# Patient Record
Sex: Male | Born: 1985 | Hispanic: Yes | Marital: Single | Smoking: Light tobacco smoker
Health system: Southern US, Community
[De-identification: ages and names within clinical notes are randomized; demographics above are authoritative.]

## PROBLEM LIST (undated history)

## (undated) DIAGNOSIS — E119 Type 2 diabetes mellitus without complications: Secondary | ICD-10-CM

## (undated) DIAGNOSIS — M549 Dorsalgia, unspecified: Secondary | ICD-10-CM

---

## 2019-07-16 ENCOUNTER — Emergency Department (HOSPITAL_COMMUNITY): Payer: Self-pay

## 2019-07-16 ENCOUNTER — Other Ambulatory Visit: Payer: Self-pay

## 2019-07-16 ENCOUNTER — Inpatient Hospital Stay (HOSPITAL_COMMUNITY): Payer: Self-pay

## 2019-07-16 ENCOUNTER — Inpatient Hospital Stay (HOSPITAL_COMMUNITY)
Admission: EM | Admit: 2019-07-16 | Discharge: 2019-07-21 | DRG: 460 | Disposition: A | Discharge status: Rehabilitation Facility | Payer: Self-pay | Attending: Neurosurgery | Admitting: Neurosurgery

## 2019-07-16 ENCOUNTER — Encounter (HOSPITAL_COMMUNITY): Payer: Self-pay | Admitting: Emergency Medicine

## 2019-07-16 DIAGNOSIS — F17210 Nicotine dependence, cigarettes, uncomplicated: Secondary | ICD-10-CM | POA: Diagnosis present

## 2019-07-16 DIAGNOSIS — Y990 Civilian activity done for income or pay: Secondary | ICD-10-CM

## 2019-07-16 DIAGNOSIS — S22010A Wedge compression fracture of first thoracic vertebra, initial encounter for closed fracture: Principal | ICD-10-CM | POA: Diagnosis present

## 2019-07-16 DIAGNOSIS — Y9269 Other specified industrial and construction area as the place of occurrence of the external cause: Secondary | ICD-10-CM

## 2019-07-16 DIAGNOSIS — Z419 Encounter for procedure for purposes other than remedying health state, unspecified: Secondary | ICD-10-CM

## 2019-07-16 DIAGNOSIS — Z20822 Contact with and (suspected) exposure to covid-19: Secondary | ICD-10-CM | POA: Diagnosis present

## 2019-07-16 DIAGNOSIS — S12690A Other displaced fracture of seventh cervical vertebra, initial encounter for closed fracture: Secondary | ICD-10-CM | POA: Diagnosis present

## 2019-07-16 DIAGNOSIS — S12190A Other displaced fracture of second cervical vertebra, initial encounter for closed fracture: Secondary | ICD-10-CM | POA: Diagnosis present

## 2019-07-16 DIAGNOSIS — E1165 Type 2 diabetes mellitus with hyperglycemia: Secondary | ICD-10-CM | POA: Diagnosis not present

## 2019-07-16 DIAGNOSIS — W208XXA Other cause of strike by thrown, projected or falling object, initial encounter: Secondary | ICD-10-CM | POA: Diagnosis present

## 2019-07-16 DIAGNOSIS — M4804 Spinal stenosis, thoracic region: Secondary | ICD-10-CM | POA: Diagnosis present

## 2019-07-16 DIAGNOSIS — M4802 Spinal stenosis, cervical region: Secondary | ICD-10-CM | POA: Diagnosis present

## 2019-07-16 DIAGNOSIS — S22000A Wedge compression fracture of unspecified thoracic vertebra, initial encounter for closed fracture: Secondary | ICD-10-CM | POA: Diagnosis present

## 2019-07-16 DIAGNOSIS — Z833 Family history of diabetes mellitus: Secondary | ICD-10-CM

## 2019-07-16 DIAGNOSIS — R0902 Hypoxemia: Secondary | ICD-10-CM | POA: Diagnosis present

## 2019-07-16 DIAGNOSIS — S22009A Unspecified fracture of unspecified thoracic vertebra, initial encounter for closed fracture: Secondary | ICD-10-CM | POA: Diagnosis present

## 2019-07-16 DIAGNOSIS — S0001XA Abrasion of scalp, initial encounter: Secondary | ICD-10-CM | POA: Diagnosis present

## 2019-07-16 DIAGNOSIS — D62 Acute posthemorrhagic anemia: Secondary | ICD-10-CM | POA: Diagnosis not present

## 2019-07-16 HISTORY — DX: Type 2 diabetes mellitus without complications: E11.9

## 2019-07-16 HISTORY — DX: Dorsalgia, unspecified: M54.9

## 2019-07-16 LAB — CBC WITH DIFFERENTIAL/PLATELET
Abs Immature Granulocytes: 0.15 10*3/uL — ABNORMAL HIGH (ref 0.00–0.07)
Basophils Absolute: 0.1 10*3/uL (ref 0.0–0.1)
Basophils Relative: 0 %
Eosinophils Absolute: 0 10*3/uL (ref 0.0–0.5)
Eosinophils Relative: 0 %
HCT: 45.9 % (ref 39.0–52.0)
Hemoglobin: 16.5 g/dL (ref 13.0–17.0)
Immature Granulocytes: 1 %
Lymphocytes Relative: 5 %
Lymphs Abs: 1.1 10*3/uL (ref 0.7–4.0)
MCH: 34 pg (ref 26.0–34.0)
MCHC: 35.9 g/dL (ref 30.0–36.0)
MCV: 94.6 fL (ref 80.0–100.0)
Monocytes Absolute: 1.2 10*3/uL — ABNORMAL HIGH (ref 0.1–1.0)
Monocytes Relative: 6 %
Neutro Abs: 17.3 10*3/uL — ABNORMAL HIGH (ref 1.7–7.7)
Neutrophils Relative %: 88 %
Platelets: 292 10*3/uL (ref 150–400)
RBC: 4.85 MIL/uL (ref 4.22–5.81)
RDW: 11.7 % (ref 11.5–15.5)
WBC: 19.8 10*3/uL — ABNORMAL HIGH (ref 4.0–10.5)
nRBC: 0 % (ref 0.0–0.2)

## 2019-07-16 LAB — BASIC METABOLIC PANEL
Anion gap: 11 (ref 5–15)
Anion gap: 14 (ref 5–15)
BUN: 8 mg/dL (ref 6–20)
BUN: 8 mg/dL (ref 6–20)
CO2: 24 mmol/L (ref 22–32)
CO2: 22 mmol/L (ref 22–32)
Calcium: 9.2 mg/dL (ref 8.9–10.3)
Calcium: 8.9 mg/dL (ref 8.9–10.3)
Chloride: 97 mmol/L — ABNORMAL LOW (ref 98–111)
Chloride: 99 mmol/L (ref 98–111)
Creatinine, Ser: 0.72 mg/dL (ref 0.61–1.24)
Creatinine, Ser: 0.66 mg/dL (ref 0.61–1.24)
GFR calc Af Amer: >60 mL/min (ref >60)
GFR calc Af Amer: >60 mL/min (ref >60)
GFR calc non Af Amer: >60 mL/min (ref >60)
GFR calc non Af Amer: >60 mL/min (ref >60)
Glucose, Bld: 299 mg/dL — ABNORMAL HIGH (ref 70–99)
Glucose, Bld: 223 mg/dL — ABNORMAL HIGH (ref 70–99)
Potassium: 4.4 mmol/L (ref 3.5–5.1)
Potassium: 4.3 mmol/L (ref 3.5–5.1)
Sodium: 132 mmol/L — ABNORMAL LOW (ref 135–145)
Sodium: 135 mmol/L (ref 135–145)

## 2019-07-16 LAB — CBC
HCT: 45 % (ref 39.0–52.0)
Hemoglobin: 16.1 g/dL (ref 13.0–17.0)
MCH: 33.7 pg (ref 26.0–34.0)
MCHC: 35.8 g/dL (ref 30.0–36.0)
MCV: 94.1 fL (ref 80.0–100.0)
Platelets: 312 10*3/uL (ref 150–400)
RBC: 4.78 MIL/uL (ref 4.22–5.81)
RDW: 11.7 % (ref 11.5–15.5)
WBC: 13 10*3/uL — ABNORMAL HIGH (ref 4.0–10.5)
nRBC: 0 % (ref 0.0–0.2)

## 2019-07-16 LAB — PROTIME-INR
INR: 1 (ref 0.8–1.2)
Prothrombin Time: 13.5 s (ref 11.4–15.2)

## 2019-07-16 LAB — RESPIRATORY PANEL BY RT PCR (FLU A&B, COVID)
Influenza A by PCR: NEGATIVE
Influenza B by PCR: NEGATIVE
SARS Coronavirus 2 by RT PCR: NEGATIVE

## 2019-07-16 LAB — HIV ANTIBODY (ROUTINE TESTING W REFLEX): HIV Screen 4th Generation wRfx: NONREACTIVE

## 2019-07-16 MED ORDER — ONDANSETRON HCL 4 MG/2ML IJ SOLN: 4.0000 mg | Freq: Four times a day (QID) | INTRAMUSCULAR | Status: DC | PRN

## 2019-07-16 MED ORDER — DOCUSATE SODIUM 100 MG PO CAPS
100.0000 mg | ORAL_CAPSULE | Freq: Two times a day (BID) | ORAL | Status: DC
  Administered 2019-07-16 – 2019-07-20 (×6): 100 mg via ORAL
  Filled 2019-07-16 (×7): qty 1

## 2019-07-16 MED ORDER — ACETAMINOPHEN 650 MG RE SUPP: 650.0000 mg | Freq: Four times a day (QID) | RECTAL | Status: DC | PRN

## 2019-07-16 MED ORDER — ONDANSETRON HCL 4 MG PO TABS: 4.0000 mg | ORAL_TABLET | Freq: Four times a day (QID) | ORAL | Status: DC | PRN

## 2019-07-16 MED ORDER — OXYCODONE-ACETAMINOPHEN 5-325 MG PO TABS
1.0000 | ORAL_TABLET | Freq: Once | ORAL | Status: AC
  Administered 2019-07-16: 1 via ORAL
  Filled 2019-07-16: qty 1

## 2019-07-16 MED ORDER — ACETAMINOPHEN 325 MG PO TABS
650.0000 mg | ORAL_TABLET | Freq: Once | ORAL | Status: AC
  Administered 2019-07-16: 650 mg via ORAL
  Filled 2019-07-16: qty 2

## 2019-07-16 MED ORDER — FENTANYL CITRATE (PF) 100 MCG/2ML IJ SOLN
25.0000 ug | Freq: Once | INTRAMUSCULAR | Status: AC
  Administered 2019-07-16: 25 ug via INTRAVENOUS
  Filled 2019-07-16: qty 2

## 2019-07-16 MED ORDER — HYDROCODONE-ACETAMINOPHEN 5-325 MG PO TABS
1.0000 | ORAL_TABLET | ORAL | Status: DC | PRN
  Administered 2019-07-18 – 2019-07-21 (×5): 2 via ORAL
  Filled 2019-07-16 (×6): qty 2

## 2019-07-16 MED ORDER — IOHEXOL 350 MG/ML SOLN
50.0000 mL | Freq: Once | INTRAVENOUS | Status: AC | PRN
  Administered 2019-07-16: 50 mL via INTRAVENOUS

## 2019-07-16 MED ORDER — ACETAMINOPHEN 325 MG PO TABS
650.0000 mg | ORAL_TABLET | Freq: Four times a day (QID) | ORAL | Status: DC | PRN
  Administered 2019-07-16 – 2019-07-19 (×2): 650 mg via ORAL
  Filled 2019-07-16 (×3): qty 2

## 2019-07-16 MED ORDER — ONDANSETRON HCL 4 MG/2ML IJ SOLN
4.0000 mg | Freq: Once | INTRAMUSCULAR | Status: AC
  Administered 2019-07-16: 4 mg via INTRAVENOUS
  Filled 2019-07-16: qty 2

## 2019-07-16 MED ORDER — HYDROMORPHONE HCL 1 MG/ML IJ SOLN
1.0000 mg | INTRAMUSCULAR | Status: DC | PRN
  Administered 2019-07-16 – 2019-07-20 (×5): 1 mg via INTRAVENOUS
  Filled 2019-07-16 (×6): qty 1

## 2019-07-16 MED ORDER — SODIUM CHLORIDE 0.9 % IV SOLN: INTRAVENOUS | Status: DC

## 2019-07-16 MED ORDER — HYDROMORPHONE HCL 1 MG/ML IJ SOLN
1.0000 mg | Freq: Once | INTRAMUSCULAR | Status: AC
  Administered 2019-07-16: 1 mg via INTRAVENOUS
  Filled 2019-07-16: qty 1

## 2019-07-16 MED ORDER — SENNA 8.6 MG PO TABS
1.0000 | ORAL_TABLET | Freq: Two times a day (BID) | ORAL | Status: DC
  Administered 2019-07-16 – 2019-07-20 (×6): 8.6 mg via ORAL
  Filled 2019-07-16 (×7): qty 1

## 2019-07-16 NOTE — ED Notes (Signed)
Patient transported to CT 

## 2019-07-16 NOTE — ED Notes (Signed)
Patient transported to MRI 

## 2019-07-16 NOTE — ED Triage Notes (Signed)
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 Pt was working when a 2x4 wall approx. 16 ft tall fell and struck him in the head. Pt denies LOC. Pt complaining of cervical neck pain. NKDA. VSS.

## 2019-07-16 NOTE — H&P (Signed)
Charles Perez is an 34 y.o. male.   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  Patient was brought to the emergency department was admitted worked up CT scan subsequent MRI scan shows what looks like a type I hangman's fracture as well as a T1 burst fracture dislocation.  History reviewed. No pertinent past medical history.  History reviewed. No pertinent surgical history.  History reviewed. No pertinent family history. Social History:  reports that he has been smoking cigarettes. He has never used smokeless tobacco. He reports that he does not use drugs. No history on file for alcohol.  Allergies: No Known Allergies  (Not in a hospital admission)   Results for orders placed or performed during the hospital encounter of 07/16/19 (from the past 48 hour(s))  CBC with Differential     Status: Abnormal   Collection Time: 07/16/19  2:05 PM  Result Value Ref Range   WBC 19.8 (H) 4.0 - 10.5 K/uL   RBC 4.85 4.22 - 5.81 MIL/uL   Hemoglobin 16.5 13.0 - 17.0 g/dL   HCT 04.5 40.9 - 81.1 %   MCV 94.6 80.0 - 100.0 fL   MCH 34.0 26.0 - 34.0 pg   MCHC 35.9 30.0 - 36.0 g/dL   RDW 91.4 78.2 - 95.6 %   Platelets 292 150 - 400 K/uL   nRBC 0.0 0.0 - 0.2 %   Neutrophils Relative % 88 %   Neutro Abs 17.3 (H) 1.7 - 7.7 K/uL   Lymphocytes Relative 5 %   Lymphs Abs 1.1 0.7 - 4.0 K/uL   Monocytes Relative 6 %   Monocytes Absolute 1.2 (H) 0.1 - 1.0 K/uL   Eosinophils Relative 0 %   Eosinophils Absolute 0.0 0.0 - 0.5 K/uL   Basophils Relative 0 %   Basophils Absolute 0.1 0.0 - 0.1 K/uL   Immature Granulocytes 1 %   Abs Immature Granulocytes 0.15 (H) 0.00 - 0.07 K/uL    Comment: Performed at [facility], [address] [place], [place] [address]  Basic metabolic panel      Status: Abnormal   Collection Time: 07/16/19  2:05 PM  Result Value Ref Range   Sodium 132 (L) 135 - 145 mmol/L   Potassium 4.4 3.5 - 5.1 mmol/L   Chloride 97 (L) 98 - 111 mmol/L   CO2 24 22 - 32 mmol/L   Glucose, Bld 299 (H) 70 - 99 mg/dL    Comment: Glucose reference range applies only to samples taken after fasting for at least 8 hours.   BUN 8 6 - 20 mg/dL   Creatinine, Ser 6.57 0.61 - 1.24 mg/dL   Calcium 9.2 8.9 - 84.6 mg/dL   GFR calc non Af Amer >60 >60 mL/min   GFR calc Af Amer >60 >60 mL/min   Anion gap 11 5 - 15    Comment: Performed at [facility], [address], [place], [place] [address]   CT Angio Head W or [place] Contrast  Result Date: 07/16/2019 CLINICAL DATA:  Cervical spine fracture. Status post trauma. Multiple cervical spine fractures. Additional history provided: Poly trauma, critical, head/cervical spine injury suspected. EXAM: CT ANGIOGRAPHY HEAD AND NECK TECHNIQUE: Multidetector CT imaging of the head and neck was performed using the standard protocol during  bolus administration of intravenous contrast. Multiplanar CT image reconstructions and MIPs were obtained to evaluate the vascular anatomy. Carotid stenosis measurements (when applicable) are obtained utilizing NASCET criteria, using the distal internal carotid diameter as the denominator. CONTRAST:  43mL OMNIPAQUE IOHEXOL 350 MG/ML SOLN COMPARISON:  CT head/cervical spine performed earlier the same day 07/16/2019 FINDINGS: CTA NECK FINDINGS Aortic arch: Common origin of the innominate and left common carotid arteries. The visualized aortic arch is unremarkable. No innominate or proximal subclavian artery stenosis. Right carotid system: The CCA and ICA are smooth and patent within the neck without stenosis. Left carotid system: CCA and ICA patent within the neck without stenosis Vertebral arteries: The vertebral arteries are codominant and patent within the neck without stenosis. No evidence of dissection or  pseudoaneurysm. Skeleton: Please refer to same day CT cervical spine for a description of multiple acute cervical and upper thoracic spine fractures. Other neck: No neck mass or cervical lymphadenopathy. Upper chest: No consolidation within the imaged lung apices. Review of the MIP images confirms the above findings CTA HEAD FINDINGS Anterior circulation: The intracranial internal carotid arteries are patent without significant stenosis. The M1 middle cerebral arteries are patent without significant stenosis. No M2 proximal branch occlusion or high-grade proximal stenosis is identified. The anterior cerebral arteries are patent without high-grade proximal stenosis. No intracranial aneurysm is identified. Posterior circulation: The intracranial vertebral arteries are patent without stenosis, as is the basilar artery. The posterior cerebral arteries are patent proximally without significant stenosis. A small left posterior communicating artery is present. A right posterior communicating artery is not definitively identified. Venous sinuses: Within limitations of contrast timing, no convincing thrombus. Anatomic variants: As described Review of the MIP images confirms the above findings IMPRESSION: CTA neck: 1. The bilateral common carotid, internal carotid and vertebral arteries are patent within the neck without evidence of traumatic vascular injury. 2. Please refer to same day CT cervical spine for a description of multiple acute fractures of the cervical and upper thoracic spine. CTA head: Unremarkable examination. No intracranial large vessel occlusion or proximal high-grade arterial stenosis. Electronically Signed   By: Jackey Loge DO   On: 07/16/2019 14:49   DG Thoracic Spine 2 View  Result Date: 07/16/2019 CLINICAL DATA:  Pain after trauma. EXAM: THORACIC SPINE 2 VIEWS COMPARISON:  None. FINDINGS: Mild curvature of the thoracic spine, apex to the right. No other malalignment. No fractures are seen. Minimal  degenerative disc disease. IMPRESSION: Mild scoliotic curvature of the thoracic spine, apex to the right. Mild degenerative changes. No other abnormalities. Electronically Signed   By: Gerome Sam III M.D   On: 07/16/2019 12:05   CT Head Wo Contrast  Result Date: 07/16/2019 CLINICAL DATA:  Neck pain after being hit in head by falling wall. EXAM: CT HEAD WITHOUT CONTRAST CT CERVICAL SPINE WITHOUT CONTRAST TECHNIQUE: Multidetector CT imaging of the head and cervical spine was performed following the standard protocol without intravenous contrast. Multiplanar CT image reconstructions of the cervical spine were also generated. COMPARISON:  None. FINDINGS: CT HEAD FINDINGS Brain: No evidence of acute infarction, hemorrhage, hydrocephalus, extra-axial collection or mass lesion/mass effect. Vascular: No hyperdense vessel or unexpected calcification. Skull: Normal. Negative for fracture or focal lesion. Sinuses/Orbits: Left maxillary mucous retention cyst is noted. Other: None. CT CERVICAL SPINE FINDINGS Alignment: Normal. Skull base and vertebrae: There appears to be a moderately compressed T1 fracture with possible posterior displacement of fracture fragment into spinal canal. Moderately displaced fracture is seen involving the posterior spinous  process of C7. Mildly displaced fractures are seen involving the bilateral pedicles of C2. Moderately displaced fracture is also seen involving the left lateral mass of C2, with disruption of the left transverse foramina. Fracture is also seen extending through the right transverse foramen. Soft tissues and spinal canal: No prevertebral fluid or swelling. No visible canal hematoma. Disc levels:  Disc spaces are maintained. Upper chest: Negative. Other: None. IMPRESSION: 1. Normal head CT. 2. Mildly displaced fractures are seen involving the bilateral pedicles of C2. Moderately displaced fracture is also seen involving the left lateral mass of C2, with disruption of the  left transverse foramina. Fracture is also seen extending through the right transverse foramen. This is concerning for vertebral artery injury, and CT angiography of the neck is recommended for further evaluation 3. Moderately displaced fracture is seen involving the posterior spinous process of C7, with mildly displaced fracture involving distal tip of spinous process of T1. 4. Moderately compressed T1 vertebral body fracture is noted with possible posterior displacement of fracture fragment into spinal canal. Critical Value/emergent results were called by telephone at the time of interpretation on 07/16/2019 at 1:15 pm to [facility] , who verbally acknowledged these results. Electronically Signed   By: Lupita RaiderJames  Green Jr M.D.   On: 07/16/2019 13:16   CT Angio Neck W and/or Wo Contrast  Result Date: 07/16/2019 CLINICAL DATA:  Cervical spine fracture. Status post trauma. Multiple cervical spine fractures. Additional history provided: Poly trauma, critical, head/cervical spine injury suspected. EXAM: CT ANGIOGRAPHY HEAD AND NECK TECHNIQUE: Multidetector CT imaging of the head and neck was performed using the standard protocol during bolus administration of intravenous contrast. Multiplanar CT image reconstructions and MIPs were obtained to evaluate the vascular anatomy. Carotid stenosis measurements (when applicable) are obtained utilizing NASCET criteria, using the distal internal carotid diameter as the denominator. CONTRAST:  50mL OMNIPAQUE IOHEXOL 350 MG/ML SOLN COMPARISON:  CT head/cervical spine performed earlier the same day 07/16/2019 FINDINGS: CTA NECK FINDINGS Aortic arch: Common origin of the innominate and left common carotid arteries. The visualized aortic arch is unremarkable. No innominate or proximal subclavian artery stenosis. Right carotid system: The CCA and ICA are smooth and patent within the neck without stenosis. Left carotid system: CCA and ICA patent within the neck without stenosis  Vertebral arteries: The vertebral arteries are codominant and patent within the neck without stenosis. No evidence of dissection or pseudoaneurysm. Skeleton: Please refer to same day CT cervical spine for a description of multiple acute cervical and upper thoracic spine fractures. Other neck: No neck mass or cervical lymphadenopathy. Upper chest: No consolidation within the imaged lung apices. Review of the MIP images confirms the above findings CTA HEAD FINDINGS Anterior circulation: The intracranial internal carotid arteries are patent without significant stenosis. The M1 middle cerebral arteries are patent without significant stenosis. No M2 proximal branch occlusion or high-grade proximal stenosis is identified. The anterior cerebral arteries are patent without high-grade proximal stenosis. No intracranial aneurysm is identified. Posterior circulation: The intracranial vertebral arteries are patent without stenosis, as is the basilar artery. The posterior cerebral arteries are patent proximally without significant stenosis. A small left posterior communicating artery is present. A right posterior communicating artery is not definitively identified. Venous sinuses: Within limitations of contrast timing, no convincing thrombus. Anatomic variants: As described Review of the MIP images confirms the above findings IMPRESSION: CTA neck: 1. The bilateral common carotid, internal carotid and vertebral arteries are patent within the neck without evidence of traumatic vascular  injury. 2. Please refer to same day CT cervical spine for a description of multiple acute fractures of the cervical and upper thoracic spine. CTA head: Unremarkable examination. No intracranial large vessel occlusion or proximal high-grade arterial stenosis. Electronically Signed   By: Jackey Loge DO   On: 07/16/2019 14:49   CT Cervical Spine Wo Contrast  Result Date: 07/16/2019 CLINICAL DATA:  Neck pain after being hit in head by falling wall.  EXAM: CT HEAD WITHOUT CONTRAST CT CERVICAL SPINE WITHOUT CONTRAST TECHNIQUE: Multidetector CT imaging of the head and cervical spine was performed following the standard protocol without intravenous contrast. Multiplanar CT image reconstructions of the cervical spine were also generated. COMPARISON:  None. FINDINGS: CT HEAD FINDINGS Brain: No evidence of acute infarction, hemorrhage, hydrocephalus, extra-axial collection or mass lesion/mass effect. Vascular: No hyperdense vessel or unexpected calcification. Skull: Normal. Negative for fracture or focal lesion. Sinuses/Orbits: Left maxillary mucous retention cyst is noted. Other: None. CT CERVICAL SPINE FINDINGS Alignment: Normal. Skull base and vertebrae: There appears to be a moderately compressed T1 fracture with possible posterior displacement of fracture fragment into spinal canal. Moderately displaced fracture is seen involving the posterior spinous process of C7. Mildly displaced fractures are seen involving the bilateral pedicles of C2. Moderately displaced fracture is also seen involving the left lateral mass of C2, with disruption of the left transverse foramina. Fracture is also seen extending through the right transverse foramen. Soft tissues and spinal canal: No prevertebral fluid or swelling. No visible canal hematoma. Disc levels:  Disc spaces are maintained. Upper chest: Negative. Other: None. IMPRESSION: 1. Normal head CT. 2. Mildly displaced fractures are seen involving the bilateral pedicles of C2. Moderately displaced fracture is also seen involving the left lateral mass of C2, with disruption of the left transverse foramina. Fracture is also seen extending through the right transverse foramen. This is concerning for vertebral artery injury, and CT angiography of the neck is recommended for further evaluation 3. Moderately displaced fracture is seen involving the posterior spinous process of C7, with mildly displaced fracture involving distal tip  of spinous process of T1. 4. Moderately compressed T1 vertebral body fracture is noted with possible posterior displacement of fracture fragment into spinal canal. Critical Value/emergent results were called by telephone at the time of interpretation on 07/16/2019 at 1:15 pm to provider [facility] , who verbally acknowledged these results. Electronically Signed   By: Lupita Raider M.D.   On: 07/16/2019 13:16   MR Cervical Spine Wo Contrast  Result Date: 07/16/2019 CLINICAL DATA:  Spine fracture EXAM: MRI CERVICAL AND THORACIC SPINE WITHOUT CONTRAST TECHNIQUE: Multiplanar and multiecho pulse sequences of the cervical spine, to include the craniocervical junction and cervicothoracic junction, and the thoracic spine, were obtained without intravenous contrast. COMPARISON:  Correlation made with CTA earlier same day FINDINGS: MRI CERVICAL SPINE Motion artifact. Alignment: Alignment is unchanged from CT. Vertebrae: C2 vertebral body fractures are more completely evaluated on the prior CT. There is some associated marrow edema. Linear STIR hyperintensity underlying the C5 superior endplate may reflect a fracture. No evidence of anterior or posterior longitudinal ligament disruption. Ligamentum flavum appears intact. Cord: No abnormal cord signal. Posterior Fossa, vertebral arteries, paraspinal tissues: Prevertebral soft tissue edema is present at the cervicothoracic junction. Midline and parasagittal soft tissue edema is present in the dorsal paraspinal muscles. Disc levels: No significant degenerative stenosis. MRI THORACIC SPINE Alignment:  Osseous retropulsion at T1 and T3. Vertebrae: Fracture of the T1 vertebral body with compression deformity  as seen on prior CT with associated marrow edema. There is also severe compression deformity of T3. Remainder of thoracic vertebral body heights are maintained. No suspicious osseous lesion. Cord: No abnormal cord signal. STIR hyperintensity in the dorsal epidural space  primarily from C7-T3 levels. Paraspinal and other soft tissues: Paravertebral edema/hematoma at the upper thoracic spine. Disc levels: There is mild to moderate stenosis at the T1-T2 and T3 levels. No significant degenerative stenosis. IMPRESSION: Fracture of C2 as seen on CT. Question of fracture underlying the C5 superior endplate. No evidence of significant ligamentous injury. Likely posttraumatic cervical dorsal paraspinal soft tissue edema. Fractures of T1 and T3 as seen on CT with endplate retropulsion and mild to moderate canal stenosis. Abnormal dorsal epidural signal primarily from C7-T3 levels may reflect epidural hemorrhage without mass effect. Electronically Signed   By: Guadlupe Spanish M.D.   On: 07/16/2019 18:18   MR THORACIC SPINE WO CONTRAST  Result Date: 07/16/2019 CLINICAL DATA:  Spine fracture EXAM: MRI CERVICAL AND THORACIC SPINE WITHOUT CONTRAST TECHNIQUE: Multiplanar and multiecho pulse sequences of the cervical spine, to include the craniocervical junction and cervicothoracic junction, and the thoracic spine, were obtained without intravenous contrast. COMPARISON:  Correlation made with CTA earlier same day FINDINGS: MRI CERVICAL SPINE Motion artifact. Alignment: Alignment is unchanged from CT. Vertebrae: C2 vertebral body fractures are more completely evaluated on the prior CT. There is some associated marrow edema. Linear STIR hyperintensity underlying the C5 superior endplate may reflect a fracture. No evidence of anterior or posterior longitudinal ligament disruption. Ligamentum flavum appears intact. Cord: No abnormal cord signal. Posterior Fossa, vertebral arteries, paraspinal tissues: Prevertebral soft tissue edema is present at the cervicothoracic junction. Midline and parasagittal soft tissue edema is present in the dorsal paraspinal muscles. Disc levels: No significant degenerative stenosis. MRI THORACIC SPINE Alignment:  Osseous retropulsion at T1 and T3. Vertebrae: Fracture of  the T1 vertebral body with compression deformity as seen on prior CT with associated marrow edema. There is also severe compression deformity of T3. Remainder of thoracic vertebral body heights are maintained. No suspicious osseous lesion. Cord: No abnormal cord signal. STIR hyperintensity in the dorsal epidural space primarily from C7-T3 levels. Paraspinal and other soft tissues: Paravertebral edema/hematoma at the upper thoracic spine. Disc levels: There is mild to moderate stenosis at the T1-T2 and T3 levels. No significant degenerative stenosis. IMPRESSION: Fracture of C2 as seen on CT. Question of fracture underlying the C5 superior endplate. No evidence of significant ligamentous injury. Likely posttraumatic cervical dorsal paraspinal soft tissue edema. Fractures of T1 and T3 as seen on CT with endplate retropulsion and mild to moderate canal stenosis. Abnormal dorsal epidural signal primarily from C7-T3 levels may reflect epidural hemorrhage without mass effect. Electronically Signed   By: Guadlupe Spanish M.D.   On: 07/16/2019 18:18    Review of Systems  Musculoskeletal: Positive for back pain and neck pain.    Blood pressure 126/73, pulse 76, temperature 98.5 F (36.9 C), temperature source Oral, resp. rate 20, height 5\' 9"  (1.753 m), weight 99.8 kg, SpO2 98 %. Physical Exam  Constitutional: He is oriented to person, place, and time. He appears well-developed.  HENT:  Head: Normocephalic.  Eyes: Pupils are equal, round, and reactive to light.  Respiratory: Effort normal.  GI: Soft.  Musculoskeletal:     Cervical back: Normal range of motion.  Neurological: He is alert and oriented to person, place, and time. He has normal strength. GCS eye subscore is 4. GCS verbal  subscore is 5. GCS motor subscore is 6.  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  Strength is 5-5 upper and lower extremities.  Sensation appears to be grossly intact reflexes are normal nonpathologic     Assessment/Plan 33 year old with type I hangman's fracture I think this will heal in a collar and a T1 burst fracture dislocation that will require surgery.  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  We will set this up for first thing in the morning.  We have answered all the questions gone over risks benefits perioperative course expectations of outcome and alternatives of surgery and they understand and agree to proceed forward.  Tafari Humiston P, MD 07/16/2019, 6:35 PM

## 2019-07-16 NOTE — Anesthesia Preprocedure Evaluation (Addendum)
Anesthesia Evaluation  Patient identified by MRN, date of birth, ID band Patient awake    Reviewed: Allergy & Precautions, NPO status , Patient's Chart, lab work & pertinent test results  Airway Mallampati: IV  TM Distance: >3 FB     Dental  (+) Dental Advisory Given   Pulmonary Current Smoker,    breath sounds clear to auscultation       Cardiovascular negative cardio ROS   Rhythm:Regular Rate:Normal     Neuro/Psych T1 burst fx negative neurological ROS     GI/Hepatic negative GI ROS, Neg liver ROS,   Endo/Other  negative endocrine ROS  Renal/GU negative Renal ROS     Musculoskeletal   Abdominal   Peds  Hematology negative hematology ROS (+)   Anesthesia Other Findings   Reproductive/Obstetrics                             Lab Results  Component Value Date   WBC 13.0 (H) 07/16/2019   HGB 16.1 07/16/2019   HCT 45.0 07/16/2019   MCV 94.1 07/16/2019   PLT 312 07/16/2019   Lab Results  Component Value Date   CREATININE 0.66 07/16/2019   BUN 8 07/16/2019   NA 135 07/16/2019   K 4.3 07/16/2019   CL 99 07/16/2019   CO2 22 07/16/2019    Anesthesia Physical Anesthesia Plan  ASA: III  Anesthesia Plan: General   Post-op Pain Management:    Induction: Intravenous  PONV Risk Score and Plan: 2 and Dexamethasone, Ondansetron, Treatment may vary due to age or medical condition and Midazolam  Airway Management Planned: Oral ETT  Additional Equipment: Arterial line  Intra-op Plan:   Post-operative Plan: Extubation in OR  Informed Consent: I have reviewed the patients History and Physical, chart, labs and discussed the procedure including the risks, benefits and alternatives for the proposed anesthesia with the patient or authorized representative who has indicated his/her understanding and acceptance.     Dental advisory given  Plan Discussed with: CRNA  Anesthesia Plan  Comments:        Anesthesia Quick Evaluation

## 2019-07-16 NOTE — Progress Notes (Signed)
Orthopedic Tech Progress Note Patient Details:  Charles Perez 04-04-1986 [id number]  Patient ID: Charles Perez, male   DOB: Charles 23, 1987, 34 y.o.   MRN: [id number]   Charles Perez 07/16/2019, 4:27 PMCalled [facility] for [facility] order.

## 2019-07-16 NOTE — ED Provider Notes (Signed)
34yo male with c spine fractures on CT, consult with neurosurgery who will see the patient after CTA (results in inteleconnect- not published in Epic- will add to MDM below) and MRI complete. Call neurosurgery when MRI is done.  Physical Exam  BP 126/73 (BP Location: Left Arm)   Pulse 76   Temp 98.5 F (36.9 C) (Oral)   Resp 20   Ht 5\' 9"  (1.753 m)   Wt 99.8 kg   SpO2 98%   BMI 32.49 kg/m   Physical Exam  ED Course/Procedures   Clinical Course as of Jul 16 1847  Sat Jul 16, 2019  1320 CT of the cervical spine showing C2 fracture, C7 fracture as well as T1 fracture.  There is concern for vertebral artery injury.  I spoke to neurosurgery Meyran who is requesting CT angios as well as MRI of the thoracic spine and cervical spine without contrast.  She will evaluate the patient in consult.   [HK]  1417  IMPRESSION: CTA neck:  1. The bilateral common carotid, internal carotid and vertebral arteries are patent within the neck without evidence of traumatic vascular injury. 2. Please refer to same day CT cervical spine for a description of multiple acute fractures of the cervical and upper thoracic spine.  CTA head:  Unremarkable examination. No intracranial large vessel occlusion or proximal high-grade arterial stenosis.   Electronically Signed By: Jul 18, 2019 DO On: 07/16/2019 14:49   [HK]    Clinical Course User Index [HK] 07/18/2019, PA-C    Procedures  MDM   IMPRESSION: CTA neck:  1. The bilateral common carotid, internal carotid and vertebral arteries are patent within the neck without evidence of traumatic vascular injury. 2. Please refer to same day CT cervical spine for a description of multiple acute fractures of the cervical and upper thoracic spine.  CTA head:  Unremarkable examination. No intracranial large vessel occlusion or proximal high-grade arterial stenosis.   Electronically Signed By: Dietrich Pates DO On: 07/16/2019 14:49   MRI  resulted, patient has been seen by neurosurgery- Dr. 07/18/2019, plan is for surgery tomorrow, neurosurgery will admit the patient, requests trauma paged for consult in care.   1848hrs, consult to Dr. Wynetta Emery with trauma, will add portable chest and pelvis, will consult.     Sheliah Hatch, PA-C 07/16/19 2337    2338, MD 07/19/19 [phone]

## 2019-07-16 NOTE — ED Provider Notes (Signed)
[facility] EMERGENCY DEPARTMENT Provider Note   CSN: [id number] Arrival date & time: 07/16/19  1045     History Chief Complaint  Patient presents with  . Head Injury    Geremy Rister is a 34 y.o. male who presents to ED with a chief complaint of neck pain.  States that he was at work at a [employer] site when a 16 foot wall fell and struck him in the head.  He felt like he had a "whiplash" type of injury. He denies any loss of consciousness or headache.  Complains of mid neck pain since the incident occurred.  Denies any prior neck surgeries or fractures.  Reports aching pain but denies any lower back pain, vision changes, vomiting, anticoagulant use, numbness in arms or legs, changes to gait, chest pain.  The history is provided by the patient. A language interpreter was used.       History reviewed. No pertinent past medical history.  There are no problems to display for this patient.   History reviewed. No pertinent surgical history.     History reviewed. No pertinent family history.  Social History   Tobacco Use  . Smoking status: Light Tobacco Smoker    Types: Cigarettes  . Smokeless tobacco: Never Used  Substance Use Topics  . Alcohol use: Not on file  . Drug use: Never    Home Medications Prior to Admission medications   Not on File    Allergies    Patient has no known allergies.  Review of Systems   Review of Systems  Constitutional: Negative for appetite change, chills and fever.  HENT: Negative for ear pain, rhinorrhea, sneezing and sore throat.   Eyes: Negative for photophobia and visual disturbance.  Respiratory: Negative for cough, chest tightness, shortness of breath and wheezing.   Cardiovascular: Negative for chest pain and palpitations.  Gastrointestinal: Negative for abdominal pain, blood in stool, constipation, diarrhea, nausea and vomiting.  Genitourinary: Negative for dysuria, hematuria and urgency.    Musculoskeletal: Positive for myalgias and neck pain.  Skin: Negative for rash.  Neurological: Negative for dizziness, weakness and light-headedness.    Physical Exam Updated Vital Signs BP 133/76 (BP Location: Left Arm)   Pulse 79   Temp 98.5 F (36.9 C) (Oral)   Resp 20   Ht 5\' 9"  (1.753 m)   Wt 99.8 kg   SpO2 96%   BMI 32.49 kg/m   Physical Exam Vitals and nursing note reviewed.  Constitutional:      General: He is not in acute distress.    Appearance: He is well-developed.  HENT:     Head: Normocephalic and atraumatic.     Nose: Nose normal.  Eyes:     General: No scleral icterus.       Left eye: No discharge.     Conjunctiva/sclera: Conjunctivae normal.  Cardiovascular:     Rate and Rhythm: Normal rate and regular rhythm.     Heart sounds: Normal heart sounds. No murmur. No friction rub. No gallop.   Pulmonary:     Effort: Pulmonary effort is normal. No respiratory distress.     Breath sounds: Normal breath sounds.  Abdominal:     General: Bowel sounds are normal. There is no distension.     Palpations: Abdomen is soft.     Tenderness: There is no abdominal tenderness. There is no guarding.  Musculoskeletal:        General: Normal range of motion.     Cervical  back: Normal range of motion and neck supple. Spinous process tenderness and muscular tenderness present.     Comments: Tenderness to palpation at the midline paraspinal musculature of the cervical spine. No midline spinal tenderness present in lumbar spine. No step-off palpated. No visible bruising, edema or temperature change noted. No objective signs of numbness present. No saddle anesthesia. 2+ DP pulses bilaterally. Sensation intact to light touch. Strength 5/5 in bilateral lower extremities.  Skin:    General: Skin is warm and dry.     Findings: No rash.     Comments: Scalp abrasion without laceration.  Neurological:     General: No focal deficit present.     Mental Status: He is alert and oriented  to person, place, and time.     Cranial Nerves: No cranial nerve deficit.     Sensory: No sensory deficit.     Motor: No weakness or abnormal muscle tone.     Coordination: Coordination normal.     Comments: Pupils reactive. No facial asymmetry noted. Cranial nerves appear grossly intact. Sensation intact to light touch on face, BUE and BLE. Strength 5/5 in BUE and BLE.     ED Results / Procedures / Treatments   Labs (all labs ordered are listed, but only abnormal results are displayed) Labs Reviewed  CBC WITH DIFFERENTIAL/PLATELET - Abnormal; Notable for the following components:      Result Value   WBC 19.8 (*)    Neutro Abs 17.3 (*)    Monocytes Absolute 1.2 (*)    Abs Immature Granulocytes 0.15 (*)    All other components within normal limits  BASIC METABOLIC PANEL - Abnormal; Notable for the following components:   Sodium 132 (*)    Chloride 97 (*)    Glucose, Bld 299 (*)    All other components within normal limits    EKG None  Radiology DG Thoracic Spine 2 View  Result Date: 07/16/2019 CLINICAL DATA:  Pain after trauma. EXAM: THORACIC SPINE 2 VIEWS COMPARISON:  None. FINDINGS: Mild curvature of the thoracic spine, apex to the right. No other malalignment. No fractures are seen. Minimal degenerative disc disease. IMPRESSION: Mild scoliotic curvature of the thoracic spine, apex to the right. Mild degenerative changes. No other abnormalities. Electronically Signed   By: Gerome Sam III M.D   On: 07/16/2019 12:05   CT Head Wo Contrast  Result Date: 07/16/2019 CLINICAL DATA:  Neck pain after being hit in head by falling wall. EXAM: CT HEAD WITHOUT CONTRAST CT CERVICAL SPINE WITHOUT CONTRAST TECHNIQUE: Multidetector CT imaging of the head and cervical spine was performed following the standard protocol without intravenous contrast. Multiplanar CT image reconstructions of the cervical spine were also generated. COMPARISON:  None. FINDINGS: CT HEAD FINDINGS Brain: No evidence  of acute infarction, hemorrhage, hydrocephalus, extra-axial collection or mass lesion/mass effect. Vascular: No hyperdense vessel or unexpected calcification. Skull: Normal. Negative for fracture or focal lesion. Sinuses/Orbits: Left maxillary mucous retention cyst is noted. Other: None. CT CERVICAL SPINE FINDINGS Alignment: Normal. Skull base and vertebrae: There appears to be a moderately compressed T1 fracture with possible posterior displacement of fracture fragment into spinal canal. Moderately displaced fracture is seen involving the posterior spinous process of C7. Mildly displaced fractures are seen involving the bilateral pedicles of C2. Moderately displaced fracture is also seen involving the left lateral mass of C2, with disruption of the left transverse foramina. Fracture is also seen extending through the right transverse foramen. Soft tissues and spinal canal: No prevertebral  fluid or swelling. No visible canal hematoma. Disc levels:  Disc spaces are maintained. Upper chest: Negative. Other: None. IMPRESSION: 1. Normal head CT. 2. Mildly displaced fractures are seen involving the bilateral pedicles of C2. Moderately displaced fracture is also seen involving the left lateral mass of C2, with disruption of the left transverse foramina. Fracture is also seen extending through the right transverse foramen. This is concerning for vertebral artery injury, and CT angiography of the neck is recommended for further evaluation 3. Moderately displaced fracture is seen involving the posterior spinous process of C7, with mildly displaced fracture involving distal tip of spinous process of T1. 4. Moderately compressed T1 vertebral body fracture is noted with possible posterior displacement of fracture fragment into spinal canal. Critical Value/emergent results were called by telephone at the time of interpretation on 07/16/2019 at 1:15 pm to provider [facility] , who verbally acknowledged these results.  Electronically Signed   By: Lupita Raider M.D.   On: 07/16/2019 13:16   CT Cervical Spine Wo Contrast  Result Date: 07/16/2019 CLINICAL DATA:  Neck pain after being hit in head by falling wall. EXAM: CT HEAD WITHOUT CONTRAST CT CERVICAL SPINE WITHOUT CONTRAST TECHNIQUE: Multidetector CT imaging of the head and cervical spine was performed following the standard protocol without intravenous contrast. Multiplanar CT image reconstructions of the cervical spine were also generated. COMPARISON:  None. FINDINGS: CT HEAD FINDINGS Brain: No evidence of acute infarction, hemorrhage, hydrocephalus, extra-axial collection or mass lesion/mass effect. Vascular: No hyperdense vessel or unexpected calcification. Skull: Normal. Negative for fracture or focal lesion. Sinuses/Orbits: Left maxillary mucous retention cyst is noted. Other: None. CT CERVICAL SPINE FINDINGS Alignment: Normal. Skull base and vertebrae: There appears to be a moderately compressed T1 fracture with possible posterior displacement of fracture fragment into spinal canal. Moderately displaced fracture is seen involving the posterior spinous process of C7. Mildly displaced fractures are seen involving the bilateral pedicles of C2. Moderately displaced fracture is also seen involving the left lateral mass of C2, with disruption of the left transverse foramina. Fracture is also seen extending through the right transverse foramen. Soft tissues and spinal canal: No prevertebral fluid or swelling. No visible canal hematoma. Disc levels:  Disc spaces are maintained. Upper chest: Negative. Other: None. IMPRESSION: 1. Normal head CT. 2. Mildly displaced fractures are seen involving the bilateral pedicles of C2. Moderately displaced fracture is also seen involving the left lateral mass of C2, with disruption of the left transverse foramina. Fracture is also seen extending through the right transverse foramen. This is concerning for vertebral artery injury, and CT  angiography of the neck is recommended for further evaluation 3. Moderately displaced fracture is seen involving the posterior spinous process of C7, with mildly displaced fracture involving distal tip of spinous process of T1. 4. Moderately compressed T1 vertebral body fracture is noted with possible posterior displacement of fracture fragment into spinal canal. Critical Value/emergent results were called by telephone at the time of interpretation on 07/16/2019 at 1:15 pm to provider [facility] , who verbally acknowledged these results. Electronically Signed   By: Lupita Raider M.D.   On: 07/16/2019 13:16    Procedures Procedures (including critical care time)  Medications Ordered in ED Medications  oxyCODONE-acetaminophen (PERCOCET/ROXICET) 5-325 MG per tablet 1 tablet (1 tablet Oral Given 07/16/19 1231)  acetaminophen (TYLENOL) tablet 650 mg (650 mg Oral Given 07/16/19 1231)  fentaNYL (SUBLIMAZE) injection 25 mcg (25 mcg Intravenous Given 07/16/19 1410)  iohexol (OMNIPAQUE) 350 MG/ML  injection 50 mL (50 mLs Intravenous Contrast Given 07/16/19 1359)    ED Course  I have reviewed the triage vital signs and the nursing notes.  Pertinent labs & imaging results that were available during my care of the patient were reviewed by me and considered in my medical decision making (see chart for details).  Clinical Course as of Jul 16 1510  Sat Jul 16, 2019  1320 CT of the cervical spine showing C2 fracture, C7 fracture as well as T1 fracture.  There is concern for vertebral artery injury.  I spoke to neurosurgery Meyran who is requesting CT angios as well as MRI of the thoracic spine and cervical spine without contrast.  She will evaluate the patient in consult.   [HK]  1417  IMPRESSION: CTA neck:  1. The bilateral common carotid, internal carotid and vertebral arteries are patent within the neck without evidence of traumatic vascular injury. 2. Please refer to same day CT cervical spine for a  description of multiple acute fractures of the cervical and upper thoracic spine.  CTA head:  Unremarkable examination. No intracranial large vessel occlusion or proximal high-grade arterial stenosis.   Electronically Signed By: Kellie Simmering DO On: 07/16/2019 14:49   [HK]    Clinical Course User Index [HK] Delia Heady, PA-C   MDM Rules/Calculators/A&P                      34 year old male presents to ED with a chief complaint of neck pain.  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  He denies any loss of consciousness.  He has been complaining of neck pain since the injury occurred.  Denies headache, vision changes, numbness in arms or legs or vomiting. On exam patient reports TTP of the cervical spine at midline paraspinal musculature.  Also reports mid upper back pain.  Upper and lower extremities are neurovascularly intact, he is alert and oriented x3.  No facial asymmetry noted.  Imaging here significant for several fractures of C2.  There is also a spinous process fracture of C7 and a fracture of T1.  We will need to obtain CT angio to rule out vertebral artery injury. Neurosurgery to evaluate the patient till MRIs return, which they recommended.  Patient is in a hard cervical collar. CTAs without abnormalities. MRIs pending, neurosurgery to see after MRIs result. Care handed off to oncoming provider.  Final Clinical Impression(s) / ED Diagnoses Final diagnoses:  Other closed displaced fracture of seventh cervical vertebra, initial encounter [facility])    Rx / DC Orders ED Discharge Orders    None     Portions of this note were generated with Dragon dictation software. Dictation errors may occur despite best attempts at proofreading.    Delia Heady, PA-C 07/16/19 1512    Lacretia Leigh, MD 07/17/19 [phone]

## 2019-07-17 ENCOUNTER — Inpatient Hospital Stay (HOSPITAL_COMMUNITY): Payer: Self-pay | Admitting: Anesthesiology

## 2019-07-17 ENCOUNTER — Inpatient Hospital Stay (HOSPITAL_COMMUNITY): Payer: Self-pay

## 2019-07-17 ENCOUNTER — Encounter (HOSPITAL_COMMUNITY)
Admission: EM | Disposition: A | Discharge status: Rehabilitation Facility | Payer: Self-pay | Source: Home / Self Care | Attending: Neurosurgery

## 2019-07-17 ENCOUNTER — Encounter (HOSPITAL_COMMUNITY): Payer: Self-pay | Admitting: Neurosurgery

## 2019-07-17 DIAGNOSIS — S22009A Unspecified fracture of unspecified thoracic vertebra, initial encounter for closed fracture: Secondary | ICD-10-CM | POA: Diagnosis present

## 2019-07-17 HISTORY — PX: POSTERIOR CERVICAL FUSION/FORAMINOTOMY: SHX5038

## 2019-07-17 LAB — ABO/RH: ABO/RH(D): A POS

## 2019-07-17 LAB — GLUCOSE, CAPILLARY
Glucose-Capillary: 259 mg/dL — ABNORMAL HIGH (ref 70–99)
Glucose-Capillary: 248 mg/dL — ABNORMAL HIGH (ref 70–99)

## 2019-07-17 LAB — SURGICAL PCR SCREEN
MRSA, PCR: NEGATIVE
Staphylococcus aureus: NEGATIVE

## 2019-07-17 LAB — TYPE AND SCREEN
ABO/RH(D): A POS
Antibody Screen: NEGATIVE

## 2019-07-17 SURGERY — POSTERIOR CERVICAL FUSION/FORAMINOTOMY LEVEL 5
Anesthesia: General

## 2019-07-17 MED ORDER — OXYCODONE HCL 5 MG PO TABS
ORAL_TABLET | ORAL | Status: AC
  Filled 2019-07-17: qty 2

## 2019-07-17 MED ORDER — DEXAMETHASONE SODIUM PHOSPHATE 10 MG/ML IJ SOLN
INTRAMUSCULAR | Status: AC
  Filled 2019-07-17: qty 1

## 2019-07-17 MED ORDER — PHENYLEPHRINE HCL-NACL 10-0.9 MG/250ML-% IV SOLN
INTRAVENOUS | Status: DC | PRN
  Administered 2019-07-17: 25 ug/min via INTRAVENOUS

## 2019-07-17 MED ORDER — MIDAZOLAM HCL 2 MG/2ML IJ SOLN
INTRAMUSCULAR | Status: AC
  Filled 2019-07-17: qty 2

## 2019-07-17 MED ORDER — LACTATED RINGERS IV SOLN: INTRAVENOUS | Status: DC | PRN

## 2019-07-17 MED ORDER — SODIUM CHLORIDE 0.9% FLUSH: 3.0000 mL | INTRAVENOUS | Status: DC | PRN

## 2019-07-17 MED ORDER — LIDOCAINE 2% (20 MG/ML) 5 ML SYRINGE
INTRAMUSCULAR | Status: AC
  Filled 2019-07-17: qty 5

## 2019-07-17 MED ORDER — PHENOL 1.4 % MT LIQD
1.0000 | OROMUCOSAL | Status: DC | PRN
  Filled 2019-07-17: qty 177

## 2019-07-17 MED ORDER — ONDANSETRON HCL 4 MG/2ML IJ SOLN: 4.0000 mg | Freq: Once | INTRAMUSCULAR | Status: DC | PRN

## 2019-07-17 MED ORDER — ACETAMINOPHEN 650 MG RE SUPP: 650.0000 mg | RECTAL | Status: DC | PRN

## 2019-07-17 MED ORDER — FENTANYL CITRATE (PF) 250 MCG/5ML IJ SOLN
INTRAMUSCULAR | Status: AC
  Filled 2019-07-17: qty 5

## 2019-07-17 MED ORDER — HYDROMORPHONE HCL 1 MG/ML IJ SOLN
INTRAMUSCULAR | Status: AC
  Administered 2019-07-18: 1 mg
  Filled 2019-07-17: qty 1

## 2019-07-17 MED ORDER — LIDOCAINE-EPINEPHRINE 1 %-1:100000 IJ SOLN
INTRAMUSCULAR | Status: DC | PRN
  Administered 2019-07-17: 10 mL via INTRADERMAL

## 2019-07-17 MED ORDER — THROMBIN 5000 UNITS EX SOLR
CUTANEOUS | Status: AC
  Filled 2019-07-17: qty 5000

## 2019-07-17 MED ORDER — BUPIVACAINE HCL (PF) 0.25 % IJ SOLN
INTRAMUSCULAR | Status: DC | PRN
  Administered 2019-07-17: 20 mL

## 2019-07-17 MED ORDER — MIDAZOLAM HCL 2 MG/2ML IJ SOLN
INTRAMUSCULAR | Status: DC | PRN
  Administered 2019-07-17: 2 mg via INTRAVENOUS

## 2019-07-17 MED ORDER — SUCCINYLCHOLINE CHLORIDE 20 MG/ML IJ SOLN
INTRAMUSCULAR | Status: DC | PRN
  Administered 2019-07-17: 120 mg via INTRAVENOUS

## 2019-07-17 MED ORDER — SODIUM CHLORIDE 0.9 % IV SOLN
INTRAVENOUS | Status: DC | PRN
  Administered 2019-07-17: 09:00:00 500 mL

## 2019-07-17 MED ORDER — ALUM & MAG HYDROXIDE-SIMETH 200-200-20 MG/5ML PO SUSP
30.0000 mL | Freq: Four times a day (QID) | ORAL | Status: DC | PRN
  Filled 2019-07-17: qty 30

## 2019-07-17 MED ORDER — FENTANYL CITRATE (PF) 250 MCG/5ML IJ SOLN
INTRAMUSCULAR | Status: DC | PRN
  Administered 2019-07-17 (×2): 50 ug via INTRAVENOUS

## 2019-07-17 MED ORDER — KETAMINE HCL 50 MG/5ML IJ SOSY
PREFILLED_SYRINGE | INTRAMUSCULAR | Status: AC
  Filled 2019-07-17: qty 10

## 2019-07-17 MED ORDER — DEXAMETHASONE SODIUM PHOSPHATE 10 MG/ML IJ SOLN
INTRAMUSCULAR | Status: DC | PRN
  Administered 2019-07-17: 5 mg via INTRAVENOUS

## 2019-07-17 MED ORDER — HYDROMORPHONE HCL 1 MG/ML IJ SOLN
0.5000 mg | INTRAMUSCULAR | Status: DC | PRN
  Administered 2019-07-17 (×3): 0.5 mg via INTRAVENOUS

## 2019-07-17 MED ORDER — LIDOCAINE-EPINEPHRINE 1 %-1:100000 IJ SOLN
INTRAMUSCULAR | Status: AC
  Filled 2019-07-17: qty 1

## 2019-07-17 MED ORDER — PANTOPRAZOLE SODIUM 40 MG IV SOLR
40.0000 mg | Freq: Every day | INTRAVENOUS | Status: DC
  Administered 2019-07-17: 40 mg via INTRAVENOUS
  Filled 2019-07-17 (×2): qty 40

## 2019-07-17 MED ORDER — ROCURONIUM BROMIDE 10 MG/ML (PF) SYRINGE
PREFILLED_SYRINGE | INTRAVENOUS | Status: AC
  Filled 2019-07-17: qty 10

## 2019-07-17 MED ORDER — KETAMINE HCL 10 MG/ML IJ SOLN
INTRAMUSCULAR | Status: DC | PRN
  Administered 2019-07-17: 10 mg via INTRAVENOUS
  Administered 2019-07-17: 40 mg via INTRAVENOUS
  Administered 2019-07-17 (×3): 10 mg via INTRAVENOUS

## 2019-07-17 MED ORDER — CEFAZOLIN SODIUM 1 G IJ SOLR
INTRAMUSCULAR | Status: AC
  Filled 2019-07-17: qty 20

## 2019-07-17 MED ORDER — ACETAMINOPHEN 325 MG PO TABS
650.0000 mg | ORAL_TABLET | ORAL | Status: DC | PRN
  Administered 2019-07-21: 650 mg via ORAL

## 2019-07-17 MED ORDER — ROCURONIUM BROMIDE 10 MG/ML (PF) SYRINGE
PREFILLED_SYRINGE | INTRAVENOUS | Status: DC | PRN
  Administered 2019-07-17: 10 mg via INTRAVENOUS
  Administered 2019-07-17: 50 mg via INTRAVENOUS
  Administered 2019-07-17: 100 mg via INTRAVENOUS
  Administered 2019-07-17: 40 mg via INTRAVENOUS

## 2019-07-17 MED ORDER — THROMBIN 20000 UNITS EX SOLR
CUTANEOUS | Status: DC | PRN
  Administered 2019-07-17: 09:00:00 20 mL via TOPICAL

## 2019-07-17 MED ORDER — LIDOCAINE 2% (20 MG/ML) 5 ML SYRINGE
INTRAMUSCULAR | Status: DC | PRN
  Administered 2019-07-17: 100 mg via INTRAVENOUS

## 2019-07-17 MED ORDER — SUGAMMADEX SODIUM 200 MG/2ML IV SOLN
INTRAVENOUS | Status: DC | PRN
  Administered 2019-07-17 (×2): 200 mg via INTRAVENOUS

## 2019-07-17 MED ORDER — OXYCODONE HCL 5 MG PO TABS
ORAL_TABLET | ORAL | Status: AC
  Filled 2019-07-17: qty 1

## 2019-07-17 MED ORDER — SUFENTANIL CITRATE 250 MCG/5ML IV SOLN
0.2500 ug/kg/h | INTRAVENOUS | Status: AC
  Administered 2019-07-17: .25 ug/kg/h via INTRAVENOUS
  Filled 2019-07-17 (×2): qty 5

## 2019-07-17 MED ORDER — ONDANSETRON HCL 4 MG/2ML IJ SOLN
INTRAMUSCULAR | Status: AC
  Filled 2019-07-17: qty 2

## 2019-07-17 MED ORDER — 0.9 % SODIUM CHLORIDE (POUR BTL) OPTIME
TOPICAL | Status: DC | PRN
  Administered 2019-07-17: 09:00:00 1000 mL

## 2019-07-17 MED ORDER — CEFAZOLIN SODIUM-DEXTROSE 2-4 GM/100ML-% IV SOLN
INTRAVENOUS | Status: AC
  Filled 2019-07-17: qty 100

## 2019-07-17 MED ORDER — ONDANSETRON HCL 4 MG PO TABS
4.0000 mg | ORAL_TABLET | Freq: Four times a day (QID) | ORAL | Status: DC | PRN
  Filled 2019-07-17: qty 1

## 2019-07-17 MED ORDER — PROPOFOL 10 MG/ML IV BOLUS
INTRAVENOUS | Status: AC
  Filled 2019-07-17: qty 20

## 2019-07-17 MED ORDER — BACITRACIN ZINC 500 UNIT/GM EX OINT
TOPICAL_OINTMENT | CUTANEOUS | Status: AC
  Filled 2019-07-17: qty 28.35

## 2019-07-17 MED ORDER — SODIUM CHLORIDE (PF) 0.9 % IJ SOLN
INTRAMUSCULAR | Status: AC
  Filled 2019-07-17: qty 10

## 2019-07-17 MED ORDER — HYDROMORPHONE HCL 1 MG/ML IJ SOLN
0.2500 mg | INTRAMUSCULAR | Status: DC | PRN
  Administered 2019-07-17 (×2): 0.5 mg via INTRAVENOUS

## 2019-07-17 MED ORDER — SODIUM CHLORIDE 0.9 % IV SOLN: 250.0000 mL | INTRAVENOUS | Status: DC

## 2019-07-17 MED ORDER — PHENYLEPHRINE 40 MCG/ML (10ML) SYRINGE FOR IV PUSH (FOR BLOOD PRESSURE SUPPORT)
PREFILLED_SYRINGE | INTRAVENOUS | Status: AC
  Filled 2019-07-17: qty 10

## 2019-07-17 MED ORDER — PROPOFOL 10 MG/ML IV BOLUS
INTRAVENOUS | Status: DC | PRN
  Administered 2019-07-17: 50 mg via INTRAVENOUS
  Administered 2019-07-17: 150 mg via INTRAVENOUS

## 2019-07-17 MED ORDER — THROMBIN 20000 UNITS EX SOLR
CUTANEOUS | Status: AC
  Filled 2019-07-17: qty 20000

## 2019-07-17 MED ORDER — KCL IN DEXTROSE-NACL 20-5-0.45 MEQ/L-%-% IV SOLN
INTRAVENOUS | Status: DC
  Filled 2019-07-17 (×4): qty 1000

## 2019-07-17 MED ORDER — CYCLOBENZAPRINE HCL 10 MG PO TABS
10.0000 mg | ORAL_TABLET | Freq: Three times a day (TID) | ORAL | Status: DC | PRN
  Administered 2019-07-18 – 2019-07-21 (×7): 10 mg via ORAL
  Filled 2019-07-17 (×7): qty 1

## 2019-07-17 MED ORDER — INSULIN ASPART 100 UNIT/ML ~~LOC~~ SOLN
SUBCUTANEOUS | Status: DC | PRN
  Administered 2019-07-17 (×2): 5 [IU] via SUBCUTANEOUS

## 2019-07-17 MED ORDER — OXYCODONE HCL 5 MG PO TABS
10.0000 mg | ORAL_TABLET | ORAL | Status: DC | PRN
  Administered 2019-07-17 – 2019-07-21 (×18): 10 mg via ORAL
  Filled 2019-07-17 (×16): qty 2

## 2019-07-17 MED ORDER — CEFAZOLIN SODIUM-DEXTROSE 2-3 GM-%(50ML) IV SOLR
INTRAVENOUS | Status: DC | PRN
  Administered 2019-07-17 (×2): 2 g via INTRAVENOUS

## 2019-07-17 MED ORDER — PHENYLEPHRINE 40 MCG/ML (10ML) SYRINGE FOR IV PUSH (FOR BLOOD PRESSURE SUPPORT)
PREFILLED_SYRINGE | INTRAVENOUS | Status: DC | PRN
  Administered 2019-07-17 (×3): 80 ug via INTRAVENOUS

## 2019-07-17 MED ORDER — MENTHOL 3 MG MT LOZG
1.0000 | LOZENGE | OROMUCOSAL | Status: DC | PRN
  Filled 2019-07-17: qty 9

## 2019-07-17 MED ORDER — HYDROMORPHONE HCL 1 MG/ML IJ SOLN
INTRAMUSCULAR | Status: AC
  Filled 2019-07-17: qty 1

## 2019-07-17 MED ORDER — EPHEDRINE 5 MG/ML INJ
INTRAVENOUS | Status: AC
  Filled 2019-07-17: qty 10

## 2019-07-17 MED ORDER — SUCCINYLCHOLINE CHLORIDE 200 MG/10ML IV SOSY
PREFILLED_SYRINGE | INTRAVENOUS | Status: AC
  Filled 2019-07-17: qty 10

## 2019-07-17 MED ORDER — ONDANSETRON HCL 4 MG/2ML IJ SOLN
4.0000 mg | Freq: Four times a day (QID) | INTRAMUSCULAR | Status: DC | PRN
  Filled 2019-07-17: qty 2

## 2019-07-17 MED ORDER — BACITRACIN ZINC 500 UNIT/GM EX OINT
TOPICAL_OINTMENT | CUTANEOUS | Status: DC | PRN
  Administered 2019-07-17: 1 via TOPICAL

## 2019-07-17 MED ORDER — SUGAMMADEX SODIUM 500 MG/5ML IV SOLN
INTRAVENOUS | Status: AC
  Filled 2019-07-17: qty 5

## 2019-07-17 MED ORDER — CEFAZOLIN SODIUM-DEXTROSE 2-4 GM/100ML-% IV SOLN
2.0000 g | Freq: Three times a day (TID) | INTRAVENOUS | Status: AC
  Administered 2019-07-17 (×2): 2 g via INTRAVENOUS
  Filled 2019-07-17 (×2): qty 100

## 2019-07-17 MED ORDER — ONDANSETRON HCL 4 MG/2ML IJ SOLN
INTRAMUSCULAR | Status: DC | PRN
  Administered 2019-07-17 (×2): 4 mg via INTRAVENOUS

## 2019-07-17 MED ORDER — BUPIVACAINE HCL (PF) 0.25 % IJ SOLN
INTRAMUSCULAR | Status: AC
  Filled 2019-07-17: qty 30

## 2019-07-17 MED ORDER — THROMBIN 5000 UNITS EX SOLR
OROMUCOSAL | Status: DC | PRN
  Administered 2019-07-17: 09:00:00 5 mL via TOPICAL

## 2019-07-17 MED ORDER — ALBUMIN HUMAN 5 % IV SOLN: INTRAVENOUS | Status: DC | PRN

## 2019-07-17 SURGICAL SUPPLY — 76 items
BAG DECANTER FOR FLEXI CONT (MISCELLANEOUS) ×2 IMPLANT
BAND RUBBER #18 3X1/16 STRL (MISCELLANEOUS) IMPLANT
BENZOIN TINCTURE PRP APPL 2/3 (GAUZE/BANDAGES/DRESSINGS) ×2 IMPLANT
BLADE CLIPPER SURG (BLADE) ×2 IMPLANT
BLADE SURG 11 STRL SS (BLADE) IMPLANT
BONE VIVIGEN FORMABLE 10CC (Bone Implant) ×2 IMPLANT
BUR MATCHSTICK NEURO 3.0 LAGG (BURR) ×2 IMPLANT
CANISTER SUCT 3000ML PPV (MISCELLANEOUS) ×2 IMPLANT
CAP LOCKING (Cap) ×4 IMPLANT
CAP LOCKING THREADED (Cap) ×16 IMPLANT
CARTRIDGE OIL MAESTRO DRILL (MISCELLANEOUS) ×1 IMPLANT
CLSR STERI-STRIP ANTIMIC 1/2X4 (GAUZE/BANDAGES/DRESSINGS) ×4 IMPLANT
CNTNR URN SCR LID CUP LEK RST (MISCELLANEOUS) ×1 IMPLANT
CONT SPEC 4OZ STRL OR WHT (MISCELLANEOUS) ×1
COVER WAND RF STERILE (DRAPES) ×2 IMPLANT
DECANTER SPIKE VIAL GLASS SM (MISCELLANEOUS) ×2 IMPLANT
DERMABOND ADVANCED (GAUZE/BANDAGES/DRESSINGS) ×2
DERMABOND ADVANCED .7 DNX12 (GAUZE/BANDAGES/DRESSINGS) ×2 IMPLANT
DIFFUSER DRILL AIR PNEUMATIC (MISCELLANEOUS) ×2 IMPLANT
DRAPE C-ARM 42X72 X-RAY (DRAPES) ×2 IMPLANT
DRAPE LAPAROTOMY 100X72 PEDS (DRAPES) ×2 IMPLANT
DRAPE MICROSCOPE LEICA (MISCELLANEOUS) IMPLANT
DRAPE SURG 17X23 STRL (DRAPES) ×8 IMPLANT
DRSG OPSITE 4X5.5 SM (GAUZE/BANDAGES/DRESSINGS) ×2 IMPLANT
DRSG OPSITE POSTOP 4X10 (GAUZE/BANDAGES/DRESSINGS) ×2 IMPLANT
DURAPREP 26ML APPLICATOR (WOUND CARE) ×2 IMPLANT
ELECT REM PT RETURN 9FT ADLT (ELECTROSURGICAL) ×2
ELECTRODE REM PT RTRN 9FT ADLT (ELECTROSURGICAL) ×1 IMPLANT
GAUZE 4X4 16PLY RFD (DISPOSABLE) IMPLANT
GAUZE SPONGE 4X4 12PLY STRL (GAUZE/BANDAGES/DRESSINGS) ×2 IMPLANT
GAUZE SPONGE 4X4 16PLY XRAY LF (GAUZE/BANDAGES/DRESSINGS) ×4 IMPLANT
GLOVE BIO SURGEON STRL SZ7 (GLOVE) ×10 IMPLANT
GLOVE BIO SURGEON STRL SZ8 (GLOVE) ×2 IMPLANT
GLOVE BIOGEL PI IND STRL 7.0 (GLOVE) ×1 IMPLANT
GLOVE BIOGEL PI IND STRL 7.5 (GLOVE) ×2 IMPLANT
GLOVE BIOGEL PI INDICATOR 7.0 (GLOVE) ×1
GLOVE BIOGEL PI INDICATOR 7.5 (GLOVE) ×2
GLOVE EXAM NITRILE XL STR (GLOVE) IMPLANT
GLOVE INDICATOR 8.5 STRL (GLOVE) ×2 IMPLANT
GOWN STRL REUS W/ TWL LRG LVL3 (GOWN DISPOSABLE) ×2 IMPLANT
GOWN STRL REUS W/ TWL XL LVL3 (GOWN DISPOSABLE) ×1 IMPLANT
GOWN STRL REUS W/TWL 2XL LVL3 (GOWN DISPOSABLE) IMPLANT
GOWN STRL REUS W/TWL LRG LVL3 (GOWN DISPOSABLE) ×2
GOWN STRL REUS W/TWL XL LVL3 (GOWN DISPOSABLE) ×1
GRAFT TRINITY ELITE LGE HUMAN (Tissue) ×2 IMPLANT
IMPL QUARTEX 3.5X14MM (Neuro Prosthesis/Implant) ×2 IMPLANT
IMPL QUARTEX 3.5X16MM (Neuro Prosthesis/Implant) ×2 IMPLANT
IMPLANT QUARTEX 3.5X14MM (Neuro Prosthesis/Implant) ×4 IMPLANT
IMPLANT QUARTEX 3.5X16MM (Neuro Prosthesis/Implant) ×4 IMPLANT
KIT BASIN OR (CUSTOM PROCEDURE TRAY) ×2 IMPLANT
KIT TURNOVER KIT B (KITS) ×2 IMPLANT
LOCKING CAP (Cap) ×8 IMPLANT
MARKER SKIN DUAL TIP RULER LAB (MISCELLANEOUS) ×2 IMPLANT
MILL MEDIUM DISP (BLADE) ×2 IMPLANT
NEEDLE HYPO 25X1 1.5 SAFETY (NEEDLE) ×2 IMPLANT
NEEDLE SPNL 20GX3.5 QUINCKE YW (NEEDLE) ×2 IMPLANT
NS IRRIG 1000ML POUR BTL (IV SOLUTION) ×2 IMPLANT
OIL CARTRIDGE MAESTRO DRILL (MISCELLANEOUS) ×2
PACK LAMINECTOMY NEURO (CUSTOM PROCEDURE TRAY) ×2 IMPLANT
PAD ARMBOARD 7.5X6 YLW CONV (MISCELLANEOUS) ×6 IMPLANT
PIN MAYFIELD SKULL DISP (PIN) ×2 IMPLANT
ROD TAPERED 4.0X350 (Rod) ×4 IMPLANT
SCREW CREO 4.5X35MM (Screw) ×4 IMPLANT
SCREW THRD CREO 4.5X32 (Screw) ×12 IMPLANT
SPONGE LAP 4X18 RFD (DISPOSABLE) IMPLANT
SPONGE SURGIFOAM ABS GEL 100 (HEMOSTASIS) ×2 IMPLANT
STRIP CLOSURE SKIN 1/2X4 (GAUZE/BANDAGES/DRESSINGS) ×4 IMPLANT
SUT ETHILON 4 0 PS 2 18 (SUTURE) IMPLANT
SUT VIC AB 0 CT1 18XCR BRD8 (SUTURE) ×2 IMPLANT
SUT VIC AB 0 CT1 8-18 (SUTURE) ×2
SUT VIC AB 2-0 CT1 18 (SUTURE) ×4 IMPLANT
SUT VIC AB 4-0 PS2 27 (SUTURE) ×2 IMPLANT
TOWEL GREEN STERILE (TOWEL DISPOSABLE) ×2 IMPLANT
TOWEL GREEN STERILE FF (TOWEL DISPOSABLE) ×2 IMPLANT
TRAY FOLEY MTR SLVR 16FR STAT (SET/KITS/TRAYS/PACK) IMPLANT
WATER STERILE IRR 1000ML POUR (IV SOLUTION) ×2 IMPLANT

## 2019-07-17 NOTE — Consult Note (Signed)
Activation and Reason: consult  Primary Survey: airway intact, breath sounds present bilateral, pulses intact  Charles Perez is an 34 y.o. male.  HPI: 34 yo male was working and a wall fell and hit him in the head. He passed out for a few seconds. He complains of pain in the back of his head, neck, and upper back. Pain is constant it does not radiate. Pain medications help the pain. He denies nausea. He denies pain anywhere else. He denies numbness or tingling.  History reviewed. No pertinent past medical history.  History reviewed. No pertinent surgical history.  History reviewed. No pertinent family history.  Social History:  reports that he has been smoking cigarettes. He has never used smokeless tobacco. He reports that he does not use drugs. No history on file for alcohol.  Allergies: No Known Allergies  Medications: I have reviewed the patient's current medications.  Results for orders placed or performed during the hospital encounter of 07/16/19 (from the past 48 hour(s))  CBC with Differential     Status: Abnormal   Collection Time: 07/16/19  2:05 PM  Result Value Ref Range   WBC 19.8 (H) 4.0 - 10.5 K/uL   RBC 4.85 4.22 - 5.81 MIL/uL   Hemoglobin 16.5 13.0 - 17.0 g/dL   HCT 16.1 09.6 - 04.5 %   MCV 94.6 80.0 - 100.0 fL   MCH 34.0 26.0 - 34.0 pg   MCHC 35.9 30.0 - 36.0 g/dL   RDW 40.9 81.1 - 91.4 %   Platelets 292 150 - 400 K/uL   nRBC 0.0 0.0 - 0.2 %   Neutrophils Relative % 88 %   Neutro Abs 17.3 (H) 1.7 - 7.7 K/uL   Lymphocytes Relative 5 %   Lymphs Abs 1.1 0.7 - 4.0 K/uL   Monocytes Relative 6 %   Monocytes Absolute 1.2 (H) 0.1 - 1.0 K/uL   Eosinophils Relative 0 %   Eosinophils Absolute 0.0 0.0 - 0.5 K/uL   Basophils Relative 0 %   Basophils Absolute 0.1 0.0 - 0.1 K/uL   Immature Granulocytes 1 %   Abs Immature Granulocytes 0.15 (H) 0.00 - 0.07 K/uL    Comment: Performed at [facility], [address], [place], [place] [address]  Basic metabolic  panel     Status: Abnormal   Collection Time: 07/16/19  2:05 PM  Result Value Ref Range   Sodium 132 (L) 135 - 145 mmol/L   Potassium 4.4 3.5 - 5.1 mmol/L   Chloride 97 (L) 98 - 111 mmol/L   CO2 24 22 - 32 mmol/L   Glucose, Bld 299 (H) 70 - 99 mg/dL    Comment: Glucose reference range applies only to samples taken after fasting for at least 8 hours.   BUN 8 6 - 20 mg/dL   Creatinine, Ser 6.21 0.61 - 1.24 mg/dL   Calcium 9.2 8.9 - 30.8 mg/dL   GFR calc non Af Amer >60 >60 mL/min   GFR calc Af Amer >60 >60 mL/min   Anion gap 11 5 - 15    Comment: Performed at [facility], [address], [place], [place] [address]  Respiratory Panel by RT PCR (Flu A&B, Covid) - Nasopharyngeal Swab     Status: None   Collection Time: 07/16/19  6:40 PM   Specimen: Nasopharyngeal Swab  Result Value Ref Range   SARS Coronavirus 2 by RT PCR NEGATIVE NEGATIVE    Comment: (NOTE) SARS-CoV-2 target nucleic acids are  NOT DETECTED. The SARS-CoV-2 RNA is generally detectable in upper respiratoy specimens during the acute phase of infection. The lowest concentration of SARS-CoV-2 viral copies this assay can detect is 131 copies/mL. A negative result does not preclude SARS-Cov-2 infection and should not be used as the sole basis for treatment or other patient management decisions. A negative result may occur with  improper specimen collection/handling, submission of specimen other than nasopharyngeal swab, presence of viral mutation(s) within the areas targeted by this assay, and inadequate number of viral copies (<131 copies/mL). A negative result must be combined with clinical observations, patient history, and epidemiological information. The expected result is Negative. Fact Sheet for Patients:  https://www.moore.com/ Fact Sheet for Healthcare Providers:  https://www.young.biz/ This test is not yet ap proved or cleared by the [place] FDA and  has been  authorized for detection and/or diagnosis of SARS-CoV-2 by FDA under an Emergency Use Authorization (EUA). This EUA will remain  in effect (meaning this test can be used) for the duration of the COVID-19 declaration under Section 564(b)(1) of the Act, 21 U.S.C. section 360bbb-3(b)(1), unless the authorization is terminated or revoked sooner.    Influenza A by PCR NEGATIVE NEGATIVE   Influenza B by PCR NEGATIVE NEGATIVE    Comment: (NOTE) The Xpert Xpress SARS-CoV-2/FLU/RSV assay is intended as an aid in  the diagnosis of influenza from Nasopharyngeal swab specimens and  should not be used as a sole basis for treatment. Nasal washings and  aspirates are unacceptable for Xpert Xpress SARS-CoV-2/FLU/RSV  testing. Fact Sheet for Patients: https://www.moore.com/ Fact Sheet for Healthcare Providers: https://www.young.biz/ This test is not yet approved or cleared by the [place] FDA and  has been authorized for detection and/or diagnosis of SARS-CoV-2 by  FDA under an Emergency Use Authorization (EUA). This EUA will remain  in effect (meaning this test can be used) for the duration of the  Covid-19 declaration under Section 564(b)(1) of the Act, 21  U.S.C. section 360bbb-3(b)(1), unless the authorization is  terminated or revoked. Performed at [facility], [address] [place], [place] [address]   HIV Antibody (routine testing w rflx)     Status: None   Collection Time: 07/16/19  8:00 PM  Result Value Ref Range   HIV Screen 4th Generation wRfx NON REACTIVE NON REACTIVE    Comment: Performed at [facility], [address], [place], [place] [address]  Basic metabolic panel     Status: Abnormal   Collection Time: 07/16/19  8:00 PM  Result Value Ref Range   Sodium 135 135 - 145 mmol/L   Potassium 4.3 3.5 - 5.1 mmol/L   Chloride 99 98 - 111 mmol/L   CO2 22 22 - 32 mmol/L   Glucose, Bld 223 (H) 70 - 99 mg/dL    Comment: Glucose  reference range applies only to samples taken after fasting for at least 8 hours.   BUN 8 6 - 20 mg/dL   Creatinine, Ser 4.81 0.61 - 1.24 mg/dL   Calcium 8.9 8.9 - 85.6 mg/dL   GFR calc non Af Amer >60 >60 mL/min   GFR calc Af Amer >60 >60 mL/min   Anion gap 14 5 - 15    Comment: Performed at [facility], [address] [place], [place] [address]  CBC     Status: Abnormal   Collection Time: 07/16/19  8:00 PM  Result Value Ref Range   WBC 13.0 (H) 4.0 - 10.5 K/uL  RBC 4.78 4.22 - 5.81 MIL/uL   Hemoglobin 16.1 13.0 - 17.0 g/dL   HCT 33.2 95.1 - 88.4 %   MCV 94.1 80.0 - 100.0 fL   MCH 33.7 26.0 - 34.0 pg   MCHC 35.8 30.0 - 36.0 g/dL   RDW 16.6 06.3 - 01.6 %   Platelets 312 150 - 400 K/uL   nRBC 0.0 0.0 - 0.2 %    Comment: Performed at [facility], [address], [place], [place] [address]  Protime-INR     Status: None   Collection Time: 07/16/19  8:00 PM  Result Value Ref Range   Prothrombin Time 13.5 11.4 - 15.2 seconds   INR 1.0 0.8 - 1.2    Comment: (NOTE) INR goal varies based on device and disease states. Performed at [facility], [address], [place], [place] [address]   Surgical pcr screen     Status: None   Collection Time: 07/17/19  3:57 AM   Specimen: Nasal Mucosa; Nasal Swab  Result Value Ref Range   MRSA, PCR NEGATIVE NEGATIVE   Staphylococcus aureus NEGATIVE NEGATIVE    Comment: (NOTE) The Xpert SA Assay (FDA approved for NASAL specimens in patients 26 years of age and older), is one component of a comprehensive surveillance program. It is not intended to diagnose infection nor to guide or monitor treatment. Performed at [facility], [address] [place], [place] [address]   Glucose, capillary     Status: Abnormal   Collection Time: 07/17/19  6:12 AM  Result Value Ref Range   Glucose-Capillary 259 (H) 70 - 99 mg/dL    Comment: Glucose reference range applies only to samples taken after fasting for at least 8 hours.     CT Angio Head W or Wo Contrast  Result Date: 07/16/2019 CLINICAL DATA:  Cervical spine fracture. Status post trauma. Multiple cervical spine fractures. Additional history provided: Poly trauma, critical, head/cervical spine injury suspected. EXAM: CT ANGIOGRAPHY HEAD AND NECK TECHNIQUE: Multidetector CT imaging of the head and neck was performed using the standard protocol during bolus administration of intravenous contrast. Multiplanar CT image reconstructions and MIPs were obtained to evaluate the vascular anatomy. Carotid stenosis measurements (when applicable) are obtained utilizing NASCET criteria, using the distal internal carotid diameter as the denominator. CONTRAST:  91mL OMNIPAQUE IOHEXOL 350 MG/ML SOLN COMPARISON:  CT head/cervical spine performed earlier the same day 07/16/2019 FINDINGS: CTA NECK FINDINGS Aortic arch: Common origin of the innominate and left common carotid arteries. The visualized aortic arch is unremarkable. No innominate or proximal subclavian artery stenosis. Right carotid system: The CCA and ICA are smooth and patent within the neck without stenosis. Left carotid system: CCA and ICA patent within the neck without stenosis Vertebral arteries: The vertebral arteries are codominant and patent within the neck without stenosis. No evidence of dissection or pseudoaneurysm. Skeleton: Please refer to same day CT cervical spine for a description of multiple acute cervical and upper thoracic spine fractures. Other neck: No neck mass or cervical lymphadenopathy. Upper chest: No consolidation within the imaged lung apices. Review of the MIP images confirms the above findings CTA HEAD FINDINGS Anterior circulation: The intracranial internal carotid arteries are patent without significant stenosis. The M1 middle cerebral arteries are patent without significant stenosis. No M2 proximal branch occlusion or high-grade proximal stenosis is identified. The anterior cerebral arteries are patent  without high-grade proximal stenosis. No intracranial aneurysm is identified. Posterior circulation: The intracranial vertebral arteries are  patent without stenosis, as is the basilar artery. The posterior cerebral arteries are patent proximally without significant stenosis. A small left posterior communicating artery is present. A right posterior communicating artery is not definitively identified. Venous sinuses: Within limitations of contrast timing, no convincing thrombus. Anatomic variants: As described Review of the MIP images confirms the above findings IMPRESSION: CTA neck: 1. The bilateral common carotid, internal carotid and vertebral arteries are patent within the neck without evidence of traumatic vascular injury. 2. Please refer to same day CT cervical spine for a description of multiple acute fractures of the cervical and upper thoracic spine. CTA head: Unremarkable examination. No intracranial large vessel occlusion or proximal high-grade arterial stenosis. Electronically Signed   By: Jackey Loge DO   On: 07/16/2019 14:49   DG Thoracic Spine 2 View  Result Date: 07/16/2019 CLINICAL DATA:  Pain after trauma. EXAM: THORACIC SPINE 2 VIEWS COMPARISON:  None. FINDINGS: Mild curvature of the thoracic spine, apex to the right. No other malalignment. No fractures are seen. Minimal degenerative disc disease. IMPRESSION: Mild scoliotic curvature of the thoracic spine, apex to the right. Mild degenerative changes. No other abnormalities. Electronically Signed   By: Gerome Sam III M.D   On: 07/16/2019 12:05   CT Head Wo Contrast  Result Date: 07/16/2019 CLINICAL DATA:  Neck pain after being hit in head by falling wall. EXAM: CT HEAD WITHOUT CONTRAST CT CERVICAL SPINE WITHOUT CONTRAST TECHNIQUE: Multidetector CT imaging of the head and cervical spine was performed following the standard protocol without intravenous contrast. Multiplanar CT image reconstructions of the cervical spine were also  generated. COMPARISON:  None. FINDINGS: CT HEAD FINDINGS Brain: No evidence of acute infarction, hemorrhage, hydrocephalus, extra-axial collection or mass lesion/mass effect. Vascular: No hyperdense vessel or unexpected calcification. Skull: Normal. Negative for fracture or focal lesion. Sinuses/Orbits: Left maxillary mucous retention cyst is noted. Other: None. CT CERVICAL SPINE FINDINGS Alignment: Normal. Skull base and vertebrae: There appears to be a moderately compressed T1 fracture with possible posterior displacement of fracture fragment into spinal canal. Moderately displaced fracture is seen involving the posterior spinous process of C7. Mildly displaced fractures are seen involving the bilateral pedicles of C2. Moderately displaced fracture is also seen involving the left lateral mass of C2, with disruption of the left transverse foramina. Fracture is also seen extending through the right transverse foramen. Soft tissues and spinal canal: No prevertebral fluid or swelling. No visible canal hematoma. Disc levels:  Disc spaces are maintained. Upper chest: Negative. Other: None. IMPRESSION: 1. Normal head CT. 2. Mildly displaced fractures are seen involving the bilateral pedicles of C2. Moderately displaced fracture is also seen involving the left lateral mass of C2, with disruption of the left transverse foramina. Fracture is also seen extending through the right transverse foramen. This is concerning for vertebral artery injury, and CT angiography of the neck is recommended for further evaluation 3. Moderately displaced fracture is seen involving the posterior spinous process of C7, with mildly displaced fracture involving distal tip of spinous process of T1. 4. Moderately compressed T1 vertebral body fracture is noted with possible posterior displacement of fracture fragment into spinal canal. Critical Value/emergent results were called by telephone at the time of interpretation on 07/16/2019 at 1:15 pm to  provider [facility] , who verbally acknowledged these results. Electronically Signed   By: Lupita Raider M.D.   On: 07/16/2019 13:16   CT Angio Neck W and/or Wo Contrast  Result Date: 07/16/2019 CLINICAL DATA:  Cervical  spine fracture. Status post trauma. Multiple cervical spine fractures. Additional history provided: Poly trauma, critical, head/cervical spine injury suspected. EXAM: CT ANGIOGRAPHY HEAD AND NECK TECHNIQUE: Multidetector CT imaging of the head and neck was performed using the standard protocol during bolus administration of intravenous contrast. Multiplanar CT image reconstructions and MIPs were obtained to evaluate the vascular anatomy. Carotid stenosis measurements (when applicable) are obtained utilizing NASCET criteria, using the distal internal carotid diameter as the denominator. CONTRAST:  50mL OMNIPAQUE IOHEXOL 350 MG/ML SOLN COMPARISON:  CT head/cervical spine performed earlier the same day 07/16/2019 FINDINGS: CTA NECK FINDINGS Aortic arch: Common origin of the innominate and left common carotid arteries. The visualized aortic arch is unremarkable. No innominate or proximal subclavian artery stenosis. Right carotid system: The CCA and ICA are smooth and patent within the neck without stenosis. Left carotid system: CCA and ICA patent within the neck without stenosis Vertebral arteries: The vertebral arteries are codominant and patent within the neck without stenosis. No evidence of dissection or pseudoaneurysm. Skeleton: Please refer to same day CT cervical spine for a description of multiple acute cervical and upper thoracic spine fractures. Other neck: No neck mass or cervical lymphadenopathy. Upper chest: No consolidation within the imaged lung apices. Review of the MIP images confirms the above findings CTA HEAD FINDINGS Anterior circulation: The intracranial internal carotid arteries are patent without significant stenosis. The M1 middle cerebral arteries are patent without  significant stenosis. No M2 proximal branch occlusion or high-grade proximal stenosis is identified. The anterior cerebral arteries are patent without high-grade proximal stenosis. No intracranial aneurysm is identified. Posterior circulation: The intracranial vertebral arteries are patent without stenosis, as is the basilar artery. The posterior cerebral arteries are patent proximally without significant stenosis. A small left posterior communicating artery is present. A right posterior communicating artery is not definitively identified. Venous sinuses: Within limitations of contrast timing, no convincing thrombus. Anatomic variants: As described Review of the MIP images confirms the above findings IMPRESSION: CTA neck: 1. The bilateral common carotid, internal carotid and vertebral arteries are patent within the neck without evidence of traumatic vascular injury. 2. Please refer to same day CT cervical spine for a description of multiple acute fractures of the cervical and upper thoracic spine. CTA head: Unremarkable examination. No intracranial large vessel occlusion or proximal high-grade arterial stenosis. Electronically Signed   By: Jackey Loge DO   On: 07/16/2019 14:49   CT Cervical Spine Wo Contrast  Result Date: 07/16/2019 CLINICAL DATA:  Neck pain after being hit in head by falling wall. EXAM: CT HEAD WITHOUT CONTRAST CT CERVICAL SPINE WITHOUT CONTRAST TECHNIQUE: Multidetector CT imaging of the head and cervical spine was performed following the standard protocol without intravenous contrast. Multiplanar CT image reconstructions of the cervical spine were also generated. COMPARISON:  None. FINDINGS: CT HEAD FINDINGS Brain: No evidence of acute infarction, hemorrhage, hydrocephalus, extra-axial collection or mass lesion/mass effect. Vascular: No hyperdense vessel or unexpected calcification. Skull: Normal. Negative for fracture or focal lesion. Sinuses/Orbits: Left maxillary mucous retention cyst is  noted. Other: None. CT CERVICAL SPINE FINDINGS Alignment: Normal. Skull base and vertebrae: There appears to be a moderately compressed T1 fracture with possible posterior displacement of fracture fragment into spinal canal. Moderately displaced fracture is seen involving the posterior spinous process of C7. Mildly displaced fractures are seen involving the bilateral pedicles of C2. Moderately displaced fracture is also seen involving the left lateral mass of C2, with disruption of the left transverse foramina. Fracture is also seen  extending through the right transverse foramen. Soft tissues and spinal canal: No prevertebral fluid or swelling. No visible canal hematoma. Disc levels:  Disc spaces are maintained. Upper chest: Negative. Other: None. IMPRESSION: 1. Normal head CT. 2. Mildly displaced fractures are seen involving the bilateral pedicles of C2. Moderately displaced fracture is also seen involving the left lateral mass of C2, with disruption of the left transverse foramina. Fracture is also seen extending through the right transverse foramen. This is concerning for vertebral artery injury, and CT angiography of the neck is recommended for further evaluation 3. Moderately displaced fracture is seen involving the posterior spinous process of C7, with mildly displaced fracture involving distal tip of spinous process of T1. 4. Moderately compressed T1 vertebral body fracture is noted with possible posterior displacement of fracture fragment into spinal canal. Critical Value/emergent results were called by telephone at the time of interpretation on 07/16/2019 at 1:15 pm to provider [facility] , who verbally acknowledged these results. Electronically Signed   By: Lupita RaiderJames  Green Jr M.D.   On: 07/16/2019 13:16   CT THORACIC SPINE WO CONTRAST  Result Date: 07/16/2019 CLINICAL DATA:  Fracture EXAM: CT THORACIC SPINE WITHOUT CONTRAST TECHNIQUE: Multidetector CT images of the thoracic were obtained using the  standard protocol without intravenous contrast. COMPARISON:  None. FINDINGS: Alignment: Osseous retropulsion at the fracture levels. Otherwise unremarkable. Vertebrae: Acute fracture of T1 vertebral body is again identified with anterior wedging and less than 50% loss height. There is retropulsion of the inferior endplate. Acute fracture of T3 vertebral body with severe loss height also again noted. There is osseous retropulsion the posterior vertebral body. Additionally, there is a nondisplaced fracture through right T3 transverse process extending to the articular process. As seen on prior imaging, there is also a fracture through the C7 spinous process extending into the lamina. Paraspinal and other soft tissues: Upper thoracic paraspinal hematoma. Disc levels: Intervertebral disc heights are maintained. IMPRESSION: As seen previously, there are acute fractures of T1 and T3 with osseous retropulsion. There is also a nondisplaced fracture through the right T3 transverse process extending to the articular process. Electronically Signed   By: Guadlupe SpanishPraneil  Patel M.D.   On: 07/16/2019 19:36   MR Cervical Spine Wo Contrast  Result Date: 07/16/2019 CLINICAL DATA:  Spine fracture EXAM: MRI CERVICAL AND THORACIC SPINE WITHOUT CONTRAST TECHNIQUE: Multiplanar and multiecho pulse sequences of the cervical spine, to include the craniocervical junction and cervicothoracic junction, and the thoracic spine, were obtained without intravenous contrast. COMPARISON:  Correlation made with CTA earlier same day FINDINGS: MRI CERVICAL SPINE Motion artifact. Alignment: Alignment is unchanged from CT. Vertebrae: C2 vertebral body fractures are more completely evaluated on the prior CT. There is some associated marrow edema. Linear STIR hyperintensity underlying the C5 superior endplate may reflect a fracture. No evidence of anterior or posterior longitudinal ligament disruption. Ligamentum flavum appears intact. Cord: No abnormal cord  signal. Posterior Fossa, vertebral arteries, paraspinal tissues: Prevertebral soft tissue edema is present at the cervicothoracic junction. Midline and parasagittal soft tissue edema is present in the dorsal paraspinal muscles. Disc levels: No significant degenerative stenosis. MRI THORACIC SPINE Alignment:  Osseous retropulsion at T1 and T3. Vertebrae: Fracture of the T1 vertebral body with compression deformity as seen on prior CT with associated marrow edema. There is also severe compression deformity of T3. Remainder of thoracic vertebral body heights are maintained. No suspicious osseous lesion. Cord: No abnormal cord signal. STIR hyperintensity in the dorsal epidural space primarily from C7-T3  levels. Paraspinal and other soft tissues: Paravertebral edema/hematoma at the upper thoracic spine. Disc levels: There is mild to moderate stenosis at the T1-T2 and T3 levels. No significant degenerative stenosis. IMPRESSION: Fracture of C2 as seen on CT. Question of fracture underlying the C5 superior endplate. No evidence of significant ligamentous injury. Likely posttraumatic cervical dorsal paraspinal soft tissue edema. Fractures of T1 and T3 as seen on CT with endplate retropulsion and mild to moderate canal stenosis. Abnormal dorsal epidural signal primarily from C7-T3 levels may reflect epidural hemorrhage without mass effect. Electronically Signed   By: Guadlupe Spanish M.D.   On: 07/16/2019 18:18   MR THORACIC SPINE WO CONTRAST  Result Date: 07/16/2019 CLINICAL DATA:  Spine fracture EXAM: MRI CERVICAL AND THORACIC SPINE WITHOUT CONTRAST TECHNIQUE: Multiplanar and multiecho pulse sequences of the cervical spine, to include the craniocervical junction and cervicothoracic junction, and the thoracic spine, were obtained without intravenous contrast. COMPARISON:  Correlation made with CTA earlier same day FINDINGS: MRI CERVICAL SPINE Motion artifact. Alignment: Alignment is unchanged from CT. Vertebrae: C2  vertebral body fractures are more completely evaluated on the prior CT. There is some associated marrow edema. Linear STIR hyperintensity underlying the C5 superior endplate may reflect a fracture. No evidence of anterior or posterior longitudinal ligament disruption. Ligamentum flavum appears intact. Cord: No abnormal cord signal. Posterior Fossa, vertebral arteries, paraspinal tissues: Prevertebral soft tissue edema is present at the cervicothoracic junction. Midline and parasagittal soft tissue edema is present in the dorsal paraspinal muscles. Disc levels: No significant degenerative stenosis. MRI THORACIC SPINE Alignment:  Osseous retropulsion at T1 and T3. Vertebrae: Fracture of the T1 vertebral body with compression deformity as seen on prior CT with associated marrow edema. There is also severe compression deformity of T3. Remainder of thoracic vertebral body heights are maintained. No suspicious osseous lesion. Cord: No abnormal cord signal. STIR hyperintensity in the dorsal epidural space primarily from C7-T3 levels. Paraspinal and other soft tissues: Paravertebral edema/hematoma at the upper thoracic spine. Disc levels: There is mild to moderate stenosis at the T1-T2 and T3 levels. No significant degenerative stenosis. IMPRESSION: Fracture of C2 as seen on CT. Question of fracture underlying the C5 superior endplate. No evidence of significant ligamentous injury. Likely posttraumatic cervical dorsal paraspinal soft tissue edema. Fractures of T1 and T3 as seen on CT with endplate retropulsion and mild to moderate canal stenosis. Abnormal dorsal epidural signal primarily from C7-T3 levels may reflect epidural hemorrhage without mass effect. Electronically Signed   By: Guadlupe Spanish M.D.   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 FINDINGS: No convincing evidence  of bony pelvic fracture or diastasis though the sacrum is partially obscured by contrast material within the urinary bladder in the femoral necks are externally rotated superimposing the greater trochanters. Radiodensities project over the left L4 transverse process and left ilium, nonspecific. Remaining soft tissues are unremarkable. IMPRESSION: No convincing evidence of bony pelvic fracture or diastasis though the sacrum is partially obscured by contrast material within the bladder. If there is concern for sacral injury, drainage of the bladder could be performed with reimaging. Alternatively, could pursue cross-sectional imaging. Femoral necks are externally rotated superimposing the greater trochanters could limit detection of femoral neck fractures. If there is concern, dedicated hip radiograph should be obtained. Electronically Signed   By: Kreg Shropshire M.D.   On: 07/16/2019 20:34  DG Chest Port 1 View  Result Date: 07/16/2019 CLINICAL DATA:  Wall fell on patient. EXAM: PORTABLE CHEST 1 VIEW COMPARISON:  None. FINDINGS: The heart size and mediastinal contours are within normal limits. Both lungs are clear. The visualized skeletal structures are unremarkable. No pneumothorax. IMPRESSION: Negative. Electronically Signed   By: Rolm Baptise M.D.   On: 07/16/2019 20:32   Review of Systems  Unable to perform ROS: Acuity of condition   PE Blood pressure 117/80, pulse 92, temperature 97.8 F (36.6 C), temperature source Oral, resp. rate 18, height 5\' 9"  (1.753 m), weight 99.8 kg, SpO2 94 %. Constitutional: NAD; conversant; no deformities Eyes: Moist conjunctiva; no lid lag; anicteric; PERRL Neck: Trachea midline; no thyromegaly, collar in place Lungs: Normal respiratory effort; no tactile fremitus CV: RRR; no palpable thrills; no pitting edema GI: Abd soft, NT, ND; no palpable hepatosplenomegaly MSK: moves all extremities 5/5 strength, unable to assess gait; no clubbing/cyanosis Psychiatric:  Appropriate affect; alert and oriented x3 Lymphatic: No palpable cervical or axillary lymphadenopathy   Assessment/Plan: 34 yo male with large object hitting him in the head. He has C2 bilateral pedicle fx and left lateral mass, C7 spinous process fx, T1 compression fx, T3 body fx. -Chest XR and pelvis XR obtained and contain no new injuries -appears injuries isolated to spine  Arta Bruce Titilayo Hagans 07/17/2019, 6:56 AM

## 2019-07-17 NOTE — Anesthesia Procedure Notes (Signed)
Procedure Name: Intubation Date/Time: 07/17/2019 8:48 AM Performed by: Modena Morrow, CRNA Pre-anesthesia Checklist: Patient identified, Emergency Drugs available, Suction available and Patient being monitored Patient Re-evaluated:Patient Re-evaluated prior to induction Oxygen Delivery Method: Circle system utilized Preoxygenation: Pre-oxygenation with 100% oxygen Induction Type: IV induction and Rapid sequence Laryngoscope Size: Glidescope and 4 Grade View: Grade I Tube type: Oral Tube size: 7.5 mm Number of attempts: 1 Airway Equipment and Method: Stylet and Oral airway Placement Confirmation: ETT inserted through vocal cords under direct vision,  positive ETCO2 and breath sounds checked- equal and bilateral Secured at: 23 cm Tube secured with: Tape Dental Injury: Teeth and Oropharynx as per pre-operative assessment

## 2019-07-17 NOTE — Progress Notes (Signed)
Orthopedic Tech Progress Note Patient Details:  Charles Perez 09-28-1985 [id number]          Saul Fordyce 07/17/2019, 4:34 [facility] for CTO brace.

## 2019-07-17 NOTE — Anesthesia Procedure Notes (Signed)
Arterial Line Insertion Start/End3/21/2021 7:25 AM, 07/10/2019 7:27 AM Performed by: CRNA  Preanesthetic checklist: patient identified, IV checked, risks and benefits discussed, surgical consent, monitors and equipment checked, pre-op evaluation and timeout performed Lidocaine 1% used for infiltration Left, radial was placed Catheter size: 20 G Hand hygiene performed , maximum sterile barriers used  and Seldinger technique used Allen's test indicative of satisfactory collateral circulation Attempts: 1 Procedure performed without using ultrasound guided technique. Following insertion, Biopatch and dressing applied. Post procedure assessment: normal  Patient tolerated the procedure well with no immediate complications.

## 2019-07-17 NOTE — Progress Notes (Signed)
Patient transported to OR. Assessments remained unchanged. 

## 2019-07-17 NOTE — Transfer of Care (Signed)
Immediate Anesthesia Transfer of Care Note  Patient: Charles Perez  Procedure(s) Performed: POSTERIOR CERVICAL-THORACIC FUSION, CERVICAL SIX-THORACIC FOUR (N/A )  Patient Location: PACU  Anesthesia Type:General  Level of Consciousness: drowsy and patient cooperative  Airway & Oxygen Therapy: Patient Spontanous Breathing and Patient connected to face mask oxygen  Post-op Assessment: Report given to RN and Post -op Vital signs reviewed and stable  Post vital signs: Reviewed and stable  Last Vitals:  Vitals Value Taken Time  BP 145/82 07/17/19 1316  Temp 37.3 C 07/17/19 1315  Pulse 102 07/17/19 1323  Resp 23 07/17/19 1323  SpO2 100 % 07/17/19 1323  Vitals shown include unvalidated device data.  Last Pain:  Vitals:   07/17/19 0500  TempSrc: Oral  PainSc:          Complications: No apparent anesthesia complications

## 2019-07-17 NOTE — Anesthesia Postprocedure Evaluation (Signed)
Anesthesia Post Note  Patient: Charles Perez  Procedure(s) Performed: POSTERIOR CERVICAL-THORACIC FUSION, CERVICAL SIX-THORACIC FOUR (N/A )     Patient location during evaluation: PACU Anesthesia Type: General Level of consciousness: awake and alert Pain management: pain level controlled Vital Signs Assessment: post-procedure vital signs reviewed and stable Respiratory status: spontaneous breathing, nonlabored ventilation, respiratory function stable and patient connected to nasal cannula oxygen Cardiovascular status: blood pressure returned to baseline and stable Postop Assessment: no apparent nausea or vomiting Anesthetic complications: no    Last Vitals:  Vitals:   07/17/19 1800 07/17/19 1830  BP: (!) 129/92 130/76  Pulse: 97 89  Resp: 20 19  Temp:    SpO2: 98% 99%    Last Pain:  Vitals:   07/17/19 1800  TempSrc:   PainSc: 5                  Kennieth Rad

## 2019-07-17 NOTE — Op Note (Signed)
Preoperative diagnosis: T1 fracture dislocation T3 compression deformity  Postoperative diagnosis: Same  Procedure: Posterior cervical thoracic fixation from C6-T4 utilizing the globus Creo set marrying up with the cortex posterior cervical set and posterior lateral arthrodesis from C6-T4 utilizing Trinity, vivigen and locally harvested autograft.  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  I extensively went over the risks and benefits of the operation with him as well as perioperative course expectations of outcome and alternatives of surgery and they understood and agreed to proceed forward.  Operative procedure: Patient was brought into the OR was additional general anesthesia positioned prone in pins after fiberoptic intubation in a cervical collar.  Postop fluoroscopy confirmed good position of his C2 alignment immediately post positioning then the backside of his neck and was shaved prepped and draped in routine sterile fashion of the linear incision was made extending from C5 down to T5.  Subperiosteal dissection was carried out there was an extensive amount of posterior spinal muscle disruption of fracture laminar and spinous process at T1 and extensive ligament disruption both over T1 as well as T3.  I dissected out the lateral masses of C6-C7 TPs and lamina from T1 down to T5.  Then utilizing AP fluoroscopy pedicle sword identified from C7 down to T4 and utilizing AP fluoroscopy  as we are not able to visualize anything in the lateral position I placed bilateral pedicle screws at all those levels.  All screws had excellent purchase fluoroscopy confirmed good position I then placed lateral mass screws of 6 check postop x-ray the all appear to be in good position.  I did remove the fractured spinal lamina chronic process at T1 child that mixed it with the autograft mix aggressively decorticated the lamina TPs from C6 facet joints down to T4 packed all the posterior lateral bone on the lamina along the TPs and the facet joints.  I then fashioned to the globus rods that were tapered with the distal end measuring up to the cortex set I placed 36mm pedicles at C7 14 mm lateral masses at C6 and 30 mm from T1-T4 with 2-35 screws.  After everything was anchored in place and everything was tightened down placed a medium Hemovac drain meticulous hemostasis was maintained although due to the extensive muscle disruption there was a moderate amount of oozing throughout the case.  Closed the wound tightly with interrupted Vicryl as a running 4 subcuticular Dermabond benzoin Steri-Strips and a sterile dressing at the end the case on needle count sponge counts were correct.

## 2019-07-17 NOTE — Progress Notes (Signed)
Patient ID: Charles Perez, male   DOB: 1986/04/14, 34 y.o.   MRN: [id number] Patient is awake alert currently getting an arterial line placed in his left arm.  Complaining of pain in his neck and the left arm denies any new symptoms in his arms and his legs moves all extremities well.  We had extensively gone over the risks and benefits of the procedure last night utilizing and his girlfriend as an Equities trader.  Went over the risks benefits perioperative course expectations of outcome and alternatives of surgery and he understood and agreed to proceed forward.

## 2019-07-18 LAB — POCT I-STAT 7, (LYTES, BLD GAS, ICA,H+H)
Acid-base deficit: 1 mmol/L (ref 0.0–2.0)
Bicarbonate: 24.5 mmol/L (ref 20.0–28.0)
Calcium, Ion: 1.11 mmol/L — ABNORMAL LOW (ref 1.15–1.40)
HCT: 30 % — ABNORMAL LOW (ref 39.0–52.0)
Hemoglobin: 10.2 g/dL — ABNORMAL LOW (ref 13.0–17.0)
O2 Saturation: 100 %
Patient temperature: 36.7
Potassium: 4.2 mmol/L (ref 3.5–5.1)
Sodium: 137 mmol/L (ref 135–145)
TCO2: 26 mmol/L (ref 22–32)
pCO2 arterial: 44.4 mmHg (ref 32.0–48.0)
pH, Arterial: 7.349 — ABNORMAL LOW (ref 7.350–7.450)
pO2, Arterial: 206 mmHg — ABNORMAL HIGH (ref 83.0–108.0)

## 2019-07-18 LAB — POCT I-STAT, CHEM 8
BUN: 10 mg/dL (ref 6–20)
BUN: 9 mg/dL (ref 6–20)
BUN: 9 mg/dL (ref 6–20)
Calcium, Ion: 1.17 mmol/L (ref 1.15–1.40)
Calcium, Ion: 1.11 mmol/L — ABNORMAL LOW (ref 1.15–1.40)
Calcium, Ion: 1.1 mmol/L — ABNORMAL LOW (ref 1.15–1.40)
Chloride: 98 mmol/L (ref 98–111)
Chloride: 101 mmol/L (ref 98–111)
Chloride: 100 mmol/L (ref 98–111)
Creatinine, Ser: 0.6 mg/dL — ABNORMAL LOW (ref 0.61–1.24)
Creatinine, Ser: 0.6 mg/dL — ABNORMAL LOW (ref 0.61–1.24)
Creatinine, Ser: 0.6 mg/dL — ABNORMAL LOW (ref 0.61–1.24)
Glucose, Bld: 259 mg/dL — ABNORMAL HIGH (ref 70–99)
Glucose, Bld: 240 mg/dL — ABNORMAL HIGH (ref 70–99)
Glucose, Bld: 241 mg/dL — ABNORMAL HIGH (ref 70–99)
HCT: 37 % — ABNORMAL LOW (ref 39.0–52.0)
HCT: 34 % — ABNORMAL LOW (ref 39.0–52.0)
HCT: 29 % — ABNORMAL LOW (ref 39.0–52.0)
Hemoglobin: 12.6 g/dL — ABNORMAL LOW (ref 13.0–17.0)
Hemoglobin: 11.6 g/dL — ABNORMAL LOW (ref 13.0–17.0)
Hemoglobin: 9.9 g/dL — ABNORMAL LOW (ref 13.0–17.0)
Potassium: 4.5 mmol/L (ref 3.5–5.1)
Potassium: 4.3 mmol/L (ref 3.5–5.1)
Potassium: 4.4 mmol/L (ref 3.5–5.1)
Sodium: 134 mmol/L — ABNORMAL LOW (ref 135–145)
Sodium: 135 mmol/L (ref 135–145)
Sodium: 136 mmol/L (ref 135–145)
TCO2: 24 mmol/L (ref 22–32)
TCO2: 27 mmol/L (ref 22–32)
TCO2: 24 mmol/L (ref 22–32)

## 2019-07-18 LAB — CBC WITH DIFFERENTIAL/PLATELET
Abs Immature Granulocytes: 0.12 10*3/uL — ABNORMAL HIGH (ref 0.00–0.07)
Basophils Absolute: 0.1 10*3/uL (ref 0.0–0.1)
Basophils Relative: 0 %
Eosinophils Absolute: 0.1 10*3/uL (ref 0.0–0.5)
Eosinophils Relative: 0 %
HCT: 29.9 % — ABNORMAL LOW (ref 39.0–52.0)
Hemoglobin: 10.8 g/dL — ABNORMAL LOW (ref 13.0–17.0)
Immature Granulocytes: 1 %
Lymphocytes Relative: 18 %
Lymphs Abs: 2.9 10*3/uL (ref 0.7–4.0)
MCH: 34.2 pg — ABNORMAL HIGH (ref 26.0–34.0)
MCHC: 36.1 g/dL — ABNORMAL HIGH (ref 30.0–36.0)
MCV: 94.6 fL (ref 80.0–100.0)
Monocytes Absolute: 1.9 10*3/uL — ABNORMAL HIGH (ref 0.1–1.0)
Monocytes Relative: 12 %
Neutro Abs: 11.2 10*3/uL — ABNORMAL HIGH (ref 1.7–7.7)
Neutrophils Relative %: 69 %
Platelets: 216 10*3/uL (ref 150–400)
RBC: 3.16 MIL/uL — ABNORMAL LOW (ref 4.22–5.81)
RDW: 11.4 % — ABNORMAL LOW (ref 11.5–15.5)
WBC: 16.2 10*3/uL — ABNORMAL HIGH (ref 4.0–10.5)
nRBC: 0 % (ref 0.0–0.2)

## 2019-07-18 MED ORDER — CHLORHEXIDINE GLUCONATE CLOTH 2 % EX PADS
6.0000 | MEDICATED_PAD | Freq: Every day | CUTANEOUS | Status: DC
  Administered 2019-07-18 – 2019-07-19 (×2): 6 via TOPICAL

## 2019-07-18 MED ORDER — PANTOPRAZOLE SODIUM 40 MG PO TBEC
40.0000 mg | DELAYED_RELEASE_TABLET | Freq: Every day | ORAL | Status: DC
  Administered 2019-07-18 – 2019-07-20 (×3): 40 mg via ORAL
  Filled 2019-07-18 (×3): qty 1

## 2019-07-18 NOTE — Progress Notes (Signed)
Subjective: Patient reports Doing okay with regard to arms and legs condition neck pain and difficulty voiding  Objective: Vital signs in last 24 hours: Temp:  [98 F (36.7 C)-99.9 F (37.7 C)] 98.4 F (36.9 C) (03/22 1209) Pulse Rate:  [89-116] 106 (03/22 1209) Resp:  [14-27] 20 (03/22 1209) BP: (119-158)/(68-95) 119/68 (03/22 1209) SpO2:  [89 %-99 %] 98 % (03/22 0756) Arterial Line BP: (138-174)/(73-95) 165/81 (03/21 1800)  Intake/Output from previous day: 03/21 0701 - 03/22 0700 In: 6230 [P.O.:360; I.V.:2600; IV Piggyback:1770] Out: 4575 [Urine:2650; Drains:225; Blood:1700] Intake/Output this shift: Total I/O In: 240 [P.O.:240] Out: 100 [Drains:100]  Patient currently in CTO strength out of 5 upper and lower extremities incision clean dry and intact  Lab Results: Recent Labs    07/16/19 1405 07/16/19 2000  WBC 19.8* 13.0*  HGB 16.5 16.1  HCT 45.9 45.0  PLT 292 312   BMET Recent Labs    07/16/19 1405 07/16/19 2000  NA 132* 135  K 4.4 4.3  CL 97* 99  CO2 24 22  GLUCOSE 299* 223*  BUN 8 8  CREATININE 0.72 0.66  CALCIUM 9.2 8.9    Studies/Results: CT Angio Head W or Wo Contrast  Result Date: 07/16/2019 CLINICAL DATA:  Cervical spine fracture. Status post trauma. Multiple cervical spine fractures. Additional history provided: Poly trauma, critical, head/cervical spine injury suspected. EXAM: CT ANGIOGRAPHY HEAD AND NECK TECHNIQUE: Multidetector CT imaging of the head and neck was performed using the standard protocol during bolus administration of intravenous contrast. Multiplanar CT image reconstructions and MIPs were obtained to evaluate the vascular anatomy. Carotid stenosis measurements (when applicable) are obtained utilizing NASCET criteria, using the distal internal carotid diameter as the denominator. CONTRAST:  60mL OMNIPAQUE IOHEXOL 350 MG/ML SOLN COMPARISON:  CT head/cervical spine performed earlier the same day 07/16/2019 FINDINGS: CTA NECK FINDINGS  Aortic arch: Common origin of the innominate and left common carotid arteries. The visualized aortic arch is unremarkable. No innominate or proximal subclavian artery stenosis. Right carotid system: The CCA and ICA are smooth and patent within the neck without stenosis. Left carotid system: CCA and ICA patent within the neck without stenosis Vertebral arteries: The vertebral arteries are codominant and patent within the neck without stenosis. No evidence of dissection or pseudoaneurysm. Skeleton: Please refer to same day CT cervical spine for a description of multiple acute cervical and upper thoracic spine fractures. Other neck: No neck mass or cervical lymphadenopathy. Upper chest: No consolidation within the imaged lung apices. Review of the MIP images confirms the above findings CTA HEAD FINDINGS Anterior circulation: The intracranial internal carotid arteries are patent without significant stenosis. The M1 middle cerebral arteries are patent without significant stenosis. No M2 proximal branch occlusion or high-grade proximal stenosis is identified. The anterior cerebral arteries are patent without high-grade proximal stenosis. No intracranial aneurysm is identified. Posterior circulation: The intracranial vertebral arteries are patent without stenosis, as is the basilar artery. The posterior cerebral arteries are patent proximally without significant stenosis. A small left posterior communicating artery is present. A right posterior communicating artery is not definitively identified. Venous sinuses: Within limitations of contrast timing, no convincing thrombus. Anatomic variants: As described Review of the MIP images confirms the above findings IMPRESSION: CTA neck: 1. The bilateral common carotid, internal carotid and vertebral arteries are patent within the neck without evidence of traumatic vascular injury. 2. Please refer to same day CT cervical spine for a description of multiple acute fractures of the  cervical and upper thoracic  spine. CTA head: Unremarkable examination. No intracranial large vessel occlusion or proximal high-grade arterial stenosis. Electronically Signed   By: Jackey Loge DO   On: 07/16/2019 14:49   CT Angio Neck W and/or Wo Contrast  Result Date: 07/16/2019 CLINICAL DATA:  Cervical spine fracture. Status post trauma. Multiple cervical spine fractures. Additional history provided: Poly trauma, critical, head/cervical spine injury suspected. EXAM: CT ANGIOGRAPHY HEAD AND NECK TECHNIQUE: Multidetector CT imaging of the head and neck was performed using the standard protocol during bolus administration of intravenous contrast. Multiplanar CT image reconstructions and MIPs were obtained to evaluate the vascular anatomy. Carotid stenosis measurements (when applicable) are obtained utilizing NASCET criteria, using the distal internal carotid diameter as the denominator. CONTRAST:  3mL OMNIPAQUE IOHEXOL 350 MG/ML SOLN COMPARISON:  CT head/cervical spine performed earlier the same day 07/16/2019 FINDINGS: CTA NECK FINDINGS Aortic arch: Common origin of the innominate and left common carotid arteries. The visualized aortic arch is unremarkable. No innominate or proximal subclavian artery stenosis. Right carotid system: The CCA and ICA are smooth and patent within the neck without stenosis. Left carotid system: CCA and ICA patent within the neck without stenosis Vertebral arteries: The vertebral arteries are codominant and patent within the neck without stenosis. No evidence of dissection or pseudoaneurysm. Skeleton: Please refer to same day CT cervical spine for a description of multiple acute cervical and upper thoracic spine fractures. Other neck: No neck mass or cervical lymphadenopathy. Upper chest: No consolidation within the imaged lung apices. Review of the MIP images confirms the above findings CTA HEAD FINDINGS Anterior circulation: The intracranial internal carotid arteries are patent  without significant stenosis. The M1 middle cerebral arteries are patent without significant stenosis. No M2 proximal branch occlusion or high-grade proximal stenosis is identified. The anterior cerebral arteries are patent without high-grade proximal stenosis. No intracranial aneurysm is identified. Posterior circulation: The intracranial vertebral arteries are patent without stenosis, as is the basilar artery. The posterior cerebral arteries are patent proximally without significant stenosis. A small left posterior communicating artery is present. A right posterior communicating artery is not definitively identified. Venous sinuses: Within limitations of contrast timing, no convincing thrombus. Anatomic variants: As described Review of the MIP images confirms the above findings IMPRESSION: CTA neck: 1. The bilateral common carotid, internal carotid and vertebral arteries are patent within the neck without evidence of traumatic vascular injury. 2. Please refer to same day CT cervical spine for a description of multiple acute fractures of the cervical and upper thoracic spine. CTA head: Unremarkable examination. No intracranial large vessel occlusion or proximal high-grade arterial stenosis. Electronically Signed   By: Jackey Loge DO   On: 07/16/2019 14:49   CT THORACIC SPINE WO CONTRAST  Result Date: 07/16/2019 CLINICAL DATA:  Fracture EXAM: CT THORACIC SPINE WITHOUT CONTRAST TECHNIQUE: Multidetector CT images of the thoracic were obtained using the standard protocol without intravenous contrast. COMPARISON:  None. FINDINGS: Alignment: Osseous retropulsion at the fracture levels. Otherwise unremarkable. Vertebrae: Acute fracture of T1 vertebral body is again identified with anterior wedging and less than 50% loss height. There is retropulsion of the inferior endplate. Acute fracture of T3 vertebral body with severe loss height also again noted. There is osseous retropulsion the posterior vertebral body.  Additionally, there is a nondisplaced fracture through right T3 transverse process extending to the articular process. As seen on prior imaging, there is also a fracture through the C7 spinous process extending into the lamina. Paraspinal and other soft tissues: Upper thoracic paraspinal  hematoma. Disc levels: Intervertebral disc heights are maintained. IMPRESSION: As seen previously, there are acute fractures of T1 and T3 with osseous retropulsion. There is also a nondisplaced fracture through the right T3 transverse process extending to the articular process. Electronically Signed   By: Guadlupe Spanish M.D.   On: 07/16/2019 19:36   MR Cervical Spine Wo Contrast  Result Date: 07/16/2019 CLINICAL DATA:  Spine fracture EXAM: MRI CERVICAL AND THORACIC SPINE WITHOUT CONTRAST TECHNIQUE: Multiplanar and multiecho pulse sequences of the cervical spine, to include the craniocervical junction and cervicothoracic junction, and the thoracic spine, were obtained without intravenous contrast. COMPARISON:  Correlation made with CTA earlier same day FINDINGS: MRI CERVICAL SPINE Motion artifact. Alignment: Alignment is unchanged from CT. Vertebrae: C2 vertebral body fractures are more completely evaluated on the prior CT. There is some associated marrow edema. Linear STIR hyperintensity underlying the C5 superior endplate may reflect a fracture. No evidence of anterior or posterior longitudinal ligament disruption. Ligamentum flavum appears intact. Cord: No abnormal cord signal. Posterior Fossa, vertebral arteries, paraspinal tissues: Prevertebral soft tissue edema is present at the cervicothoracic junction. Midline and parasagittal soft tissue edema is present in the dorsal paraspinal muscles. Disc levels: No significant degenerative stenosis. MRI THORACIC SPINE Alignment:  Osseous retropulsion at T1 and T3. Vertebrae: Fracture of the T1 vertebral body with compression deformity as seen on prior CT with associated marrow  edema. There is also severe compression deformity of T3. Remainder of thoracic vertebral body heights are maintained. No suspicious osseous lesion. Cord: No abnormal cord signal. STIR hyperintensity in the dorsal epidural space primarily from C7-T3 levels. Paraspinal and other soft tissues: Paravertebral edema/hematoma at the upper thoracic spine. Disc levels: There is mild to moderate stenosis at the T1-T2 and T3 levels. No significant degenerative stenosis. IMPRESSION: Fracture of C2 as seen on CT. Question of fracture underlying the C5 superior endplate. No evidence of significant ligamentous injury. Likely posttraumatic cervical dorsal paraspinal soft tissue edema. Fractures of T1 and T3 as seen on CT with endplate retropulsion and mild to moderate canal stenosis. Abnormal dorsal epidural signal primarily from C7-T3 levels may reflect epidural hemorrhage without mass effect. Electronically Signed   By: Guadlupe Spanish M.D.   On: 07/16/2019 18:18   MR THORACIC SPINE WO CONTRAST  Result Date: 07/16/2019 CLINICAL DATA:  Spine fracture EXAM: MRI CERVICAL AND THORACIC SPINE WITHOUT CONTRAST TECHNIQUE: Multiplanar and multiecho pulse sequences of the cervical spine, to include the craniocervical junction and cervicothoracic junction, and the thoracic spine, were obtained without intravenous contrast. COMPARISON:  Correlation made with CTA earlier same day FINDINGS: MRI CERVICAL SPINE Motion artifact. Alignment: Alignment is unchanged from CT. Vertebrae: C2 vertebral body fractures are more completely evaluated on the prior CT. There is some associated marrow edema. Linear STIR hyperintensity underlying the C5 superior endplate may reflect a fracture. No evidence of anterior or posterior longitudinal ligament disruption. Ligamentum flavum appears intact. Cord: No abnormal cord signal. Posterior Fossa, vertebral arteries, paraspinal tissues: Prevertebral soft tissue edema is present at the cervicothoracic junction.  Midline and parasagittal soft tissue edema is present in the dorsal paraspinal muscles. Disc levels: No significant degenerative stenosis. MRI THORACIC SPINE Alignment:  Osseous retropulsion at T1 and T3. Vertebrae: Fracture of the T1 vertebral body with compression deformity as seen on prior CT with associated marrow edema. There is also severe compression deformity of T3. Remainder of thoracic vertebral body heights are maintained. No suspicious osseous lesion. Cord: No abnormal cord signal. STIR hyperintensity in the  dorsal epidural space primarily from C7-T3 levels. Paraspinal and other soft tissues: Paravertebral edema/hematoma at the upper thoracic spine. Disc levels: There is mild to moderate stenosis at the T1-T2 and T3 levels. No significant degenerative stenosis. IMPRESSION: Fracture of C2 as seen on CT. Question of fracture underlying the C5 superior endplate. No evidence of significant ligamentous injury. Likely posttraumatic cervical dorsal paraspinal soft tissue edema. Fractures of T1 and T3 as seen on CT with endplate retropulsion and mild to moderate canal stenosis. Abnormal dorsal epidural signal primarily from C7-T3 levels may reflect epidural hemorrhage without mass effect. Electronically Signed   By: Macy Mis M.D.   On: 07/16/2019 18:18   DG Thoracic Spine 1 View  Result Date: 07/17/2019 CLINICAL DATA:  C6-T6 fusion. Fluoroscopy time 3 minutes 27 seconds. EXAM: DG C-ARM 1-60 MIN; THORACIC SPINE - 1 VIEW CONTRAST:  Not applicable FLUOROSCOPY TIME:  Fluoroscopy Time:  3 minutes 27 seconds COMPARISON:  CT of the thoracic spine on 07/16/2019 FINDINGS: Four images are submitted, demonstrating surgical access and placement of transpedicular screws. Transpedicular screws are identified at C6, C7, T1, T2, T3, T4, and T5. By history fusion extends to T6. IMPRESSION: Intraoperative images during thoracic spine fusion. Electronically Signed   By: Nolon Nations M.D.   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 FINDINGS: No convincing evidence of bony pelvic fracture or diastasis though the sacrum is partially obscured by contrast material within the urinary bladder in the femoral necks are externally rotated superimposing the greater trochanters. Radiodensities project over the left L4 transverse process and left ilium, nonspecific. Remaining soft tissues are unremarkable. IMPRESSION: No convincing evidence of bony pelvic fracture or diastasis though the sacrum is partially obscured by contrast material within the bladder. If there is concern for sacral injury, drainage of the bladder could be performed with reimaging. Alternatively, could pursue cross-sectional imaging. Femoral necks are externally rotated superimposing the greater trochanters could limit detection of femoral neck fractures. If there is concern, dedicated hip radiograph should be obtained. Electronically Signed   By: Lovena Le M.D.   On: 07/16/2019 20:34   DG Chest Port 1 View  Result Date: 07/16/2019 CLINICAL DATA:  Wall fell on patient. EXAM: PORTABLE CHEST 1 VIEW COMPARISON:  None. FINDINGS: The heart size and mediastinal contours are within normal limits. Both lungs are clear. The visualized skeletal structures are unremarkable. No pneumothorax. IMPRESSION: Negative. Electronically Signed   By: Rolm Baptise M.D.   On: 07/16/2019 20:32   DG C-Arm 1-60 Min  Result Date: 07/17/2019 CLINICAL DATA:  C6-T6 fusion. Fluoroscopy time 3 minutes 27 seconds. EXAM: DG C-ARM 1-60 MIN; THORACIC SPINE - 1 VIEW CONTRAST:  Not applicable FLUOROSCOPY TIME:  Fluoroscopy Time:  3 minutes 27 seconds COMPARISON:  CT of the thoracic spine on 07/16/2019 FINDINGS: Four images are submitted, demonstrating surgical access and placement of transpedicular screws. Transpedicular screws are identified at C6, C7,  T1, T2, T3, T4, and T5. By history fusion extends to T6. IMPRESSION: Intraoperative images during thoracic spine fusion. Electronically Signed   By: Nolon Nations M.D.   On: 07/17/2019 12:44    Assessment/Plan: Patient can be mobilized in his CTO will need to adjust the CTO when patient sitting probably too aggressive taking Foley out this morning recommend replacing Foley  LOS: 2 days     Chelsa Stout P 07/18/2019, 1:36  PM

## 2019-07-18 NOTE — Evaluation (Signed)
Physical Therapy Evaluation Patient Details Name: Charles Perez MRN: [id number] DOB: November 10, 1985 Today's Date: 07/18/2019   History of Present Illness  34 yo male presenting with neck pain; pt was at work at a [employer] site when a 16 foot wall fell and struck him in the head. CT showing C2 bilateral pedicle fx and left lateral mass, C7 spinous process fx, T1 compression fx, and T3 body fx. S/p C6-T4 posterior pedicle fusion. No significant PMH.   Clinical Impression  Patient presents with decreased mobility due to pain, decreased balance, decreased activity tolerance, decreased awareness of precautions and generalized weakness.  Currently +2 A for mobility at the bedside.  Feel he will progress well once pain better controlled and hope to be able to go home with follow up HHPT and family support.  PT to follow acutely.     Follow Up Recommendations Home health PT    Equipment Recommendations  Rolling walker with 5" wheels;3in1 (PT)    Recommendations for Other Services       Precautions / Restrictions Precautions Precautions: Fall;Cervical;Back Precaution Booklet Issued: No Required Braces or Orthoses: Cervical Brace;Spinal Brace Cervical Brace: Hard collar;At all times Spinal Brace: Applied in supine position;Other (comment)      Mobility  Bed Mobility Overal bed mobility: Needs Assistance Bed Mobility: Rolling;Sidelying to Sit;Sit to Sidelying Rolling: Mod assist;+2 for physical assistance;+2 for safety/equipment;Max assist Sidelying to sit: Max assist;+2 for physical assistance;+2 for safety/equipment     Sit to sidelying: Max assist;+2 for physical assistance;+2 for safety/equipment General bed mobility comments: Educating pt on log roll technique. Mod A +2 for rolling to get OOB and then Max A for lowering BLEs and elevating trunk. Max A +2 for returning to supine   Transfers Overall transfer level: Needs assistance Equipment used: Rolling walker (2  wheeled) Transfers: Sit to/from Stand Sit to Stand: Min assist;+2 physical assistance;+2 safety/equipment;From elevated surface         General transfer comment: Min A +2 for power up into standing. Pt with slow and cautious movement. Cues for hand placement  Ambulation/Gait Ambulation/Gait assistance: Mod assist;+2 safety/equipment;Min assist Gait Distance (Feet): 2 Feet Assistive device: Rolling walker (2 wheeled) Gait Pattern/deviations: Step-to pattern     General Gait Details: side steps with RW toward [facility]  Stairs            Wheelchair Mobility    Modified Rankin (Stroke Patients Only)       Balance Overall balance assessment: Needs assistance Sitting-balance support: No upper extremity supported;Feet supported Sitting balance-Leahy Scale: Fair     Standing balance support: Bilateral upper extremity supported;During functional activity Standing balance-Leahy Scale: Poor Standing balance comment: Reliant on UE support                             Pertinent Vitals/Pain Pain Assessment: Faces Faces Pain Scale: Hurts whole lot Pain Location: Neck and upper back Pain Descriptors / Indicators: Constant;Discomfort;Grimacing Pain Intervention(s): Monitored during session;Repositioned;Limited activity within patient's tolerance    Home Living Family/patient expects to be discharged to:: Private residence Living Arrangements: Spouse/significant other;Other relatives Available Help at Discharge: Family Type of Home: House Home Access: Stairs to enter   [employer] of Steps: 2 Home Layout: One level Home Equipment: None      Prior Function Level of Independence: Independent         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[employer], counselling  Extremity/Trunk Assessment   Upper Extremity Assessment Upper Extremity Assessment: Defer to OT evaluation    Lower Extremity Assessment Lower Extremity Assessment: Overall WFL for  tasks assessed    Cervical / Trunk Assessment Cervical / Trunk Assessment: Other exceptions Cervical / Trunk Exceptions: Posterior cervical thoracic fixation from C6-T4   Communication   Communication: Prefers language other than English(Spanish speaking used video interpreter Renaldo [phone])  Cognition Arousal/Alertness: Awake/alert Behavior During Therapy: WFL for tasks assessed/performed Overall Cognitive Status: Within Functional Limits for tasks assessed                                        General Comments General comments (skin integrity, edema, etc.): VSS during activity SpO2 86% on RA once back in supine, placed on 1L O2 with cues for PLB and SpO2 up to 93%    Exercises     Assessment/Plan    PT Assessment Patient needs continued PT services  PT Problem List Decreased strength;Decreased activity tolerance;Decreased mobility;Decreased safety awareness;Pain;Decreased knowledge of use of DME       PT Treatment Interventions Stair training;DME instruction;Gait training;Functional mobility training;Therapeutic exercise;Patient/family education;Therapeutic activities;Balance training    PT Goals (Current goals can be found in the Care Plan section)  Acute Rehab PT Goals Patient Stated Goal: Return to home PT Goal Formulation: With patient Time For Goal Achievement: 08/01/19    Frequency Min 5X/week   Barriers to discharge        Co-evaluation PT/OT/SLP Co-Evaluation/Treatment: Yes Reason for Co-Treatment: Complexity of the patient's impairments (multi-system involvement);To address functional/ADL transfers;For patient/therapist safety PT goals addressed during session: Mobility/safety with mobility;Balance;Proper use of DME         AM-PAC PT "6 Clicks" Mobility  Outcome Measure Help needed turning from your back to your side while in a flat bed without using bedrails?: A Lot Help needed moving from lying on your back to sitting on the side of  a flat bed without using bedrails?: Total Help needed moving to and from a bed to a chair (including a wheelchair)?: Total Help needed standing up from a chair using your arms (e.g., wheelchair or bedside chair)?: A Lot Help needed to walk in hospital room?: Total Help needed climbing 3-5 steps with a railing? : Total 6 Click Score: 8    End of Session Equipment Utilized During Treatment: Cervical collar;Back brace Activity Tolerance: Patient limited by pain Patient left: in bed;with call bell/phone within reach;with bed alarm set   PT Visit Diagnosis: Difficulty in walking, not elsewhere classified (R26.2);Muscle weakness (generalized) (M62.81)    Time: 8182-9937 PT Time Calculation (min) (ACUTE ONLY): 24 min   Charges:   PT Evaluation $PT Eval Moderate Complexity: [place], [place] Acute Rehabilitation Services [phone] 07/18/2019   Reginia Naas 07/18/2019, 2:42 PM

## 2019-07-18 NOTE — Progress Notes (Signed)
RN removed A-line per order. Interpreter used, Ricki Rodriguez [phone]. Pt understands to call RN if bleeding occurs. Pt tolerated well.

## 2019-07-18 NOTE — Evaluation (Signed)
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 CT showing C2 bilateral pedicle fx and left lateral mass, C7 spinous process fx, T1 compression fx, and T3 body fx. S/p C6-T4 posterior pedicle fusion. No significant PMH.    Clinical Impression   PTA, pt was independent and living with his wife. Pt currently requiring Mod-Max A for UB ADLs, Max A for LB ADLs, and Min A +2 for functional mobility with RW. Pt with limited ROM and activity tolerance due to significant pain. Despite significant pain, pt agreeable to therapy. Use of video Spanish interpreter throughout. Pt will require further acute OT to facilitate safe dc. Recommend dc to home with HHOT for further OT to optimize safety, independence with ADLs, and return to PLOF.      Follow Up Recommendations  Home health OT;Supervision/Assistance - 24 hour    Equipment Recommendations  3 in 1 bedside commode    Recommendations for Other Services PT consult     Precautions / Restrictions Precautions Precautions: Fall;Cervical;Back Precaution Booklet Issued: No Required Braces or Orthoses: Cervical Brace;Spinal Brace Cervical Brace: Hard collar;At all times Spinal Brace: Applied in supine position;Other (comment)(CTO Brace)      Mobility Bed Mobility Overal bed mobility: Needs Assistance Bed Mobility: Rolling;Sidelying to Sit;Sit to Sidelying Rolling: Mod assist;+2 for physical assistance;+2 for safety/equipment;Max assist Sidelying to sit: Max assist;+2 for physical assistance;+2 for safety/equipment     Sit to sidelying: Max assist;+2 for physical assistance;+2 for safety/equipment General bed mobility comments: Educating pt on log roll technique. Mod A +2 for rolling to get OOB and then Max A  for lowering BLEs and elevating trunk. Max A +2 for returning to supine   Transfers Overall transfer level: Needs assistance Equipment used: Rolling walker (2 wheeled) Transfers: Sit to/from Stand Sit to Stand: Min assist;+2 physical assistance;+2 safety/equipment;From elevated surface         General transfer comment: Min A +2 for power up into standing. Pt with slow and cautious movement. Cues for hand placement    Balance Overall balance assessment: Needs assistance Sitting-balance support: No upper extremity supported;Feet supported Sitting balance-Leahy Scale: Fair     Standing balance support: Bilateral upper extremity supported;During functional activity Standing balance-Leahy Scale: Poor Standing balance comment: Reliant on UE support                           ADL either performed or assessed with clinical judgement   ADL Overall ADL's : Needs assistance/impaired Eating/Feeding: Sitting;Bed level;Supervision/ safety;Set up   Grooming: Supervision/safety;Set up;Sitting;Bed level   Upper Body Bathing: Moderate assistance;Sitting;Bed level   Lower Body Bathing: Maximal assistance;Sit to/from stand;Bed level   Upper Body Dressing : Maximal assistance;Sitting;Bed level   Lower Body Dressing: Maximal assistance;Bed level;Sitting/lateral leans   Toilet Transfer: +2 for physical assistance;+2 for safety/equipment;Minimal assistance;RW(simulated at EOB) Toilet Transfer Details (indicate cue type and reason): Min A +2 for sit<>Stand and then Min A for safety with side steps towards [facility]         Functional mobility during ADLs: Minimal assistance;+2 for physical assistance;+2 for safety/equipment;Rolling walker General ADL Comments: Pt presenting with decreased strength, balance, ROM, and activity tolerance due to pain. Pt requiring increased assistance for bathing and dressing due  to ROM limitations with pain and back restrictions     Vision          Perception     Praxis      Pertinent Vitals/Pain Pain Assessment: Faces Faces Pain Scale: Hurts whole lot Pain Location: Neck and upper back Pain Descriptors / Indicators: Constant;Discomfort;Grimacing Pain Intervention(s): Monitored during session;Limited activity within patient's tolerance;Repositioned     Hand Dominance Right   Extremity/Trunk Assessment Upper Extremity Assessment Upper Extremity Assessment: Overall WFL for tasks assessed   Lower Extremity Assessment Lower Extremity Assessment: Defer to PT evaluation   Cervical / Trunk Assessment Cervical / Trunk Assessment: Other exceptions Cervical / Trunk Exceptions: Posterior cervical thoracic fixation from C6-T4    Communication Communication Communication: Prefers language other than English(Spanish video interpreter Donnetta Simpers [phone])   Cognition Arousal/Alertness: Awake/alert Behavior During Therapy: WFL for tasks assessed/performed Overall Cognitive Status: Within Functional Limits for tasks assessed                                     General Comments  VSS during activity. Spo2 dropping to 86% on RA one back in supine. Placed on 1L O2 and Pt elevating to 93%    Exercises     Shoulder Instructions      Home Living Family/patient expects to be discharged to:: Private residence Living Arrangements: Spouse/significant other;Other relatives Available Help at Discharge: Family Type of Home: House Home Access: Stairs to enter [employer] of Steps: 2   Home Layout: One level     Bathroom Shower/Tub: Chief Strategy Officer: Standard     Home Equipment: None          Prior Functioning/Environment Level of Independence: Independent        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Garment/textile technologist Problem List: Decreased strength;Decreased range of motion;Decreased activity tolerance;Impaired balance (sitting and/or standing);Decreased knowledge of use of DME or  AE;Decreased knowledge of precautions;Pain      OT Treatment/Interventions: Self-care/ADL training;Therapeutic exercise;Energy conservation;DME and/or AE instruction;Therapeutic activities;Patient/family education    OT Goals(Current goals can be found in the care plan section) Acute Rehab OT Goals Patient Stated Goal: Return to home OT Goal Formulation: With patient Time For Goal Achievement: 08/01/19 Potential to Achieve Goals: Good  OT Frequency: Min 3X/week   Barriers to D/C:            Co-evaluation PT/OT/SLP Co-Evaluation/Treatment: Yes Reason for Co-Treatment: Complexity of the patient's impairments (multi-system involvement);To address functional/ADL transfers;For patient/therapist safety   OT goals addressed during session: ADL's and self-care      AM-PAC OT "6 Clicks" Daily Activity     Outcome Measure Help from another person eating meals?: None Help from another person taking care of personal grooming?: A Little Help from another person toileting, which includes using toliet, bedpan, or urinal?: A Lot Help from another person bathing (including washing, rinsing, drying)?: A Lot Help from another person to put on and taking off regular upper body clothing?: A Lot Help from another person to put on and taking off regular lower body clothing?: A Lot 6 Click Score: 15   End of Session Equipment Utilized During Treatment: Gait belt;Back brace;Cervical collar;Rolling walker Nurse Communication: Mobility status  Activity Tolerance: Patient tolerated treatment well Patient left: in bed;with call bell/phone within reach  OT Visit Diagnosis: Unsteadiness on feet (R26.81);Muscle weakness (generalized) (M62.81);Other abnormalities of gait and mobility (  R26.89);Pain Pain - part of body: (Back)                Time: 1833-5825 OT Time Calculation (min): 30 min Charges:  OT General Charges $OT Visit: 1 Visit OT Evaluation $OT Eval Moderate Complexity: [place], OTR/L Acute Rehab Pager: [phone] Office: [place] 07/18/2019, 10:27 AM

## 2019-07-18 NOTE — Care Management (Signed)
Patient uninsured.  Changed pharmacy to [facility]. Added [facility] to [facility].   Await PT/OT follow up.   Ronny Flurry RN

## 2019-07-19 ENCOUNTER — Encounter (HOSPITAL_COMMUNITY): Payer: Self-pay | Admitting: Neurosurgery

## 2019-07-19 DIAGNOSIS — S22000S Wedge compression fracture of unspecified thoracic vertebra, sequela: Secondary | ICD-10-CM

## 2019-07-19 NOTE — Progress Notes (Signed)
Subjective: Patient reports Patient seems to be okay awake and alert condition of neck pain but denies any pain numbness or tingling in his arms and his legs.  Objective: Vital signs in last 24 hours: Temp:  [98 F (36.7 C)-100 F (37.8 C)] 98.3 F (36.8 C) (03/23 0433) Pulse Rate:  [105-115] 115 (03/22 1535) Resp:  [20-26] 22 (03/22 1535) BP: (113-121)/(68-86) 113/86 (03/22 1535) SpO2:  [92 %-98 %] 92 % (03/22 1535)  Intake/Output from previous day: 03/22 0701 - 03/23 0700 In: 1032.3 [P.O.:240; I.V.:792.3] Out: 3050 [Urine:2950; Drains:100] Intake/Output this shift: No intake/output data recorded.  Moves all extremities well incision clean dry and intact drain output still significant at 100 with a full cartridge now  Lab Results: Recent Labs    07/16/19 2000 07/17/19 1006 07/17/19 1141 07/18/19 1939  WBC 13.0*  --   --  16.2*  HGB 16.1   < > 10.2* 10.8*  HCT 45.0   < > 30.0* 29.9*  PLT 312  --   --  216   < > = values in this interval not displayed.   BMET Recent Labs    07/16/19 1405 07/16/19 1405 07/16/19 2000 07/17/19 1006 07/17/19 1104 07/17/19 1104 07/17/19 1137 07/17/19 1141  NA 132*   < > 135   < > 135   < > 136 137  K 4.4   < > 4.3   < > 4.3   < > 4.4 4.2  CL 97*   < > 99   < > 101  --  100  --   CO2 24  --  22  --   --   --   --   --   GLUCOSE 299*   < > 223*   < > 240*  --  241*  --   BUN 8   < > 8   < > 9  --  9  --   CREATININE 0.72   < > 0.66   < > 0.60*  --  0.60*  --   CALCIUM 9.2  --  8.9  --   --   --   --   --    < > = values in this interval not displayed.    Studies/Results: DG Thoracic Spine 1 View  Result Date: 07/17/2019 CLINICAL DATA:  C6-T6 fusion. Fluoroscopy time 3 minutes 27 seconds. EXAM: DG C-ARM 1-60 MIN; THORACIC SPINE - 1 VIEW CONTRAST:  Not applicable FLUOROSCOPY TIME:  Fluoroscopy Time:  3 minutes 27 seconds COMPARISON:  CT of the thoracic spine on 07/16/2019 FINDINGS: Four images are submitted, demonstrating surgical  access and placement of transpedicular screws. Transpedicular screws are identified at C6, C7, T1, T2, T3, T4, and T5. By history fusion extends to T6. IMPRESSION: Intraoperative images during thoracic spine fusion. Electronically Signed   By: Nolon Nations M.D.   On: 07/17/2019 12:44   DG C-Arm 1-60 Min  Result Date: 07/17/2019 CLINICAL DATA:  C6-T6 fusion. Fluoroscopy time 3 minutes 27 seconds. EXAM: DG C-ARM 1-60 MIN; THORACIC SPINE - 1 VIEW CONTRAST:  Not applicable FLUOROSCOPY TIME:  Fluoroscopy Time:  3 minutes 27 seconds COMPARISON:  CT of the thoracic spine on 07/16/2019 FINDINGS: Four images are submitted, demonstrating surgical access and placement of transpedicular screws. Transpedicular screws are identified at C6, C7, T1, T2, T3, T4, and T5. By history fusion extends to T6. IMPRESSION: Intraoperative images during thoracic spine fusion. Electronically Signed   By: Nolon Nations M.D.  On: 07/17/2019 12:44    Assessment/Plan: Postop day 2 C6-T4 pedicle screw fixation patient maintains in a CTO brace mobilize with physical and Occupational Therapy rehab consult  LOS: 3 days     Cobi Delph P 07/19/2019, 7:36 AM

## 2019-07-19 NOTE — Consult Note (Signed)
Physical Medicine and Rehabilitation Consult   Reason for Consult: SCI with functional deficits.  Referring Physician: Dr. Wynetta Emery   HPI: Charles Perez is a 34 y.o. 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 He was admitted for work up on 03/20 and has complaints of neck pain. He was found to have moderately displaced fracture involving bilateral pedicles and extending thorough transverse foramina, fractures of T1 vertebral body with compression deformity with marrow edema and severe compression deformity of T3 with mild to moderate canal stenosis and abnormal dorsal epidural signal from C7 to T3 question due to epidural hemorrhage. CTA head/neck without vascular injury or occlusion. Hangman's fracture was placed in cervical collar. He was taken to OR emergently for  C6-T4 arthrodesis by Dr. Wynetta Emery.   Post op noted to have  ABLA and hyperglycemia. To be mobilized in CTO brace and therapy evaluations showed limitations due to pain with generalized weakness as well as hypoxia with activity. MD recommending CIR due to functional deficits.     ROS Complains of pain at cervical incision site. Medication provides some relief. +constipation. Sleeping well at night. Denies fever, chills, +fatigue, denies nausea, vomiting, abdominal pain, leg pain, +voiding, weakness.    History reviewed. No pertinent past medical history.    History reviewed. No pertinent surgical history.    History reviewed. No pertinent family history.    Social History:  reports that he has been smoking cigarettes. He has never used smokeless tobacco. He reports that he does not use drugs. No history on file for alcohol.    Allergies: No Known Allergies    No medications prior to admission.    Home: Home Living Family/patient expects to be discharged to:: Private residence Living Arrangements: Spouse/significant other, Other relatives Available Help at Discharge: Family Type of  Home: House Home Access: Stairs to enter Secretary/administrator of Steps: 2 Home Layout: One level Bathroom Shower/Tub: Engineer, manufacturing systems: Standard Home Equipment: None  Functional History: Prior Function Level of Independence: Independent 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Presenter, broadcasting Status:  Mobility: Bed Mobility Overal bed mobility: Needs Assistance Bed Mobility: Rolling, Sidelying to Sit, Sit to Sidelying Rolling: Mod assist, +2 for physical assistance, +2 for safety/equipment, Max assist Sidelying to sit: Max assist, +2 for physical assistance, +2 for safety/equipment Sit to sidelying: Max assist, +2 for physical assistance, +2 for safety/equipment General bed mobility comments: Educating pt on log roll technique. Mod A +2 for rolling to get OOB and then Max A for lowering BLEs and elevating trunk. Max A +2 for returning to supine  Transfers Overall transfer level: Needs assistance Equipment used: Rolling walker (2 wheeled) Transfers: Sit to/from Stand Sit to Stand: Min assist, +2 physical assistance, +2 safety/equipment, From elevated surface General transfer comment: Min A +2 for power up into standing. Pt with slow and cautious movement. Cues for hand placement Ambulation/Gait Ambulation/Gait assistance: Mod assist, +2 safety/equipment, Min assist Gait Distance (Feet): 2 Feet Assistive device: Rolling walker (2 wheeled) Gait Pattern/deviations: Step-to pattern General Gait Details: side steps with RW toward HOB    ADL: ADL Overall ADL's : Needs assistance/impaired Eating/Feeding: Sitting, Bed level, Supervision/ safety, Set up Grooming: Supervision/safety, Set up, Sitting, Bed level Upper Body Bathing: Moderate assistance, Sitting, Bed level Lower Body Bathing: Maximal assistance, Sit to/from stand, Bed level Upper Body Dressing : Maximal assistance, Sitting, Bed level Lower Body Dressing: Maximal assistance, Bed level, Sitting/lateral leans Toilet  Transfer: +2 for  physical assistance, +2 for safety/equipment, Minimal assistance, RW(simulated at EOB) Toilet Transfer Details (indicate cue type and reason): Min A +2 for sit<>Stand and then Min A for safety with side steps towards [facility] Functional mobility during ADLs: Minimal assistance, +2 for physical assistance, +2 for safety/equipment, Rolling walker General ADL Comments: Pt presenting with decreased strength, balance, ROM, and activity tolerance due to pain. Pt requiring increased assistance for bathing and dressing due to ROM limitations with pain and back restrictions  Cognition: Cognition Overall Cognitive Status: Within Functional Limits for tasks assessed Orientation Level: Oriented X4 Cognition Arousal/Alertness: Awake/alert Behavior During Therapy: WFL for tasks assessed/performed Overall Cognitive Status: Within Functional Limits for tasks assessed  Blood pressure (!) 111/45, pulse 100, temperature 98.5 F (36.9 C), temperature source Oral, resp. rate 20, height 5\' 9"  (1.753 m), weight 99.8 kg, SpO2 (!) 87 %.   Physical Exam General: Alert and oriented x 3, No apparent distress HEENT: Head is normocephalic, atraumatic, PERRLA, EOMI, sclera anicteric, oral mucosa pink and moist, dentition intact, ext ear canals clear, cervical collar in place Neck: Supple without JVD or lymphadenopathy Heart: Reg rate and rhythm. No murmurs rubs or gallops Chest: CTA bilaterally without wheezes, rales, or rhonchi; no distress Abdomen: Soft, non-tender, non-distended, bowel sounds positive. Extremities: No clubbing, cyanosis, or edema. Pulses are 2+ Skin: Clean and intact without signs of breakdown Neuro: Pt is cognitively appropriate with normal insight, memory, and awareness. Cranial nerves 2-12 are intact. Sensory exam is normal. States name, month, and place. Off by 2 days on date.  Musculoskeletal: 4/5 strength diffusely, limited by fatigue.  Psych: Pt's affect is appropriate. Pt is  cooperative  Results for orders placed or performed during the hospital encounter of 07/16/19 (from the past 24 hour(s))  CBC with Differential/Platelet     Status: Abnormal   Collection Time: 07/18/19  7:39 PM  Result Value Ref Range   WBC 16.2 (H) 4.0 - 10.5 K/uL   RBC 3.16 (L) 4.22 - 5.81 MIL/uL   Hemoglobin 10.8 (L) 13.0 - 17.0 g/dL   HCT 07/20/19 (L) 16.1 - 09.6 %   MCV 94.6 80.0 - 100.0 fL   MCH 34.2 (H) 26.0 - 34.0 pg   MCHC 36.1 (H) 30.0 - 36.0 g/dL   RDW 04.5 (L) 40.9 - 81.1 %   Platelets 216 150 - 400 K/uL   nRBC 0.0 0.0 - 0.2 %   Neutrophils Relative % 69 %   Neutro Abs 11.2 (H) 1.7 - 7.7 K/uL   Lymphocytes Relative 18 %   Lymphs Abs 2.9 0.7 - 4.0 K/uL   Monocytes Relative 12 %   Monocytes Absolute 1.9 (H) 0.1 - 1.0 K/uL   Eosinophils Relative 0 %   Eosinophils Absolute 0.1 0.0 - 0.5 K/uL   Basophils Relative 0 %   Basophils Absolute 0.1 0.0 - 0.1 K/uL   Immature Granulocytes 1 %   Abs Immature Granulocytes 0.12 (H) 0.00 - 0.07 K/uL   DG Thoracic Spine 1 View  Result Date: 07/17/2019 CLINICAL DATA:  C6-T6 fusion. Fluoroscopy time 3 minutes 27 seconds. EXAM: DG C-ARM 1-60 MIN; THORACIC SPINE - 1 VIEW CONTRAST:  Not applicable FLUOROSCOPY TIME:  Fluoroscopy Time:  3 minutes 27 seconds COMPARISON:  CT of the thoracic spine on 07/16/2019 FINDINGS: Four images are submitted, demonstrating surgical access and placement of transpedicular screws. Transpedicular screws are identified at C6, C7, T1, T2, T3, T4, and T5. By history fusion extends to T6. IMPRESSION: Intraoperative images during thoracic spine fusion.  Electronically Signed   By: Nolon Nations M.D.   On: 07/17/2019 12:44   DG C-Arm 1-60 Min  Result Date: 07/17/2019 CLINICAL DATA:  C6-T6 fusion. Fluoroscopy time 3 minutes 27 seconds. EXAM: DG C-ARM 1-60 MIN; THORACIC SPINE - 1 VIEW CONTRAST:  Not applicable FLUOROSCOPY TIME:  Fluoroscopy Time:  3 minutes 27 seconds COMPARISON:  CT of the thoracic spine on 07/16/2019  FINDINGS: Four images are submitted, demonstrating surgical access and placement of transpedicular screws. Transpedicular screws are identified at C6, C7, T1, T2, T3, T4, and T5. By history fusion extends to T6. IMPRESSION: Intraoperative images during thoracic spine fusion. Electronically Signed   By: Nolon Nations M.D.   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 Does the need for close, 24 hr/day medical supervision in concert with the patient's rehab needs make it unreasonable for this patient to be served in a less intensive setting? Yes 2. Co-Morbidities requiring supervision/potential complications: C2 bilateral pedicle fractures, left lateral mass fracture, C7 spinous process fracture, T1 compression fracture, T3 body fracture 3. Due to bladder management, bowel management, safety, skin/wound care, disease management, medication administration, pain management and patient education, does the patient require 24 hr/day rehab nursing? Yes 4. Does the patient require coordinated care of a physician, rehab nurse, therapy disciplines of PT, OT to address physical and functional deficits in the context of the above medical diagnosis(es)? Yes Addressing deficits in the following areas: balance, endurance, locomotion, strength, transferring, bowel/bladder control, bathing, dressing, feeding, grooming, toileting and psychosocial support 5. Can the patient actively participate in an intensive therapy program of at least 3 hrs of therapy per day at least 5 days per week? Yes 6. The potential for patient to make measurable gains while on inpatient rehab is excellent 7. Anticipated functional outcomes upon discharge from inpatient rehab are modified independent  with PT, modified independent with OT, independent with SLP. 8. Estimated rehab length of stay to reach the above functional goals is: 20-22  days 9. Anticipated discharge destination: Home 10. Overall Rehab/Functional Prognosis: excellent  RECOMMENDATIONS: This patient's condition is appropriate for continued rehabilitative care in the following setting: CIR Patient has agreed to participate in recommended program. Yes Note that insurance prior authorization may be required for reimbursement for recommended care.  Comment: Mr. Hollars would be an excellent CIR candidate. He lives with his significant other. Currently receiving IV fluids. Last received IV pain medication yesterday morning, now on po Oxycodone and Norco. Continues to have some incision site pain and also fatigued. Appeared to have a lot of pain in PT evaluation yesterday. Can consider scheduling Tylenol as he is using sparingly.  Thank you for this consult. We will continue to follow in Mr. Prigmore's care.   Bary Leriche, PA-C 07/19/2019   I have personally performed a face to face diagnostic evaluation, including, but not limited to relevant history and physical exam findings, of this patient and developed relevant assessment and plan.  Additionally, I have reviewed and concur with the physician assistant's documentation above.  Leeroy Cha, MD

## 2019-07-19 NOTE — Progress Notes (Signed)
Physical Therapy Treatment Patient Details Name: Charles Perez MRN: [id number] DOB: February 13, 1986 Today's Date: 07/19/2019    History of Present Illness 34 yo male presenting with neck pain; pt was at work at a [employer] site when a 16 foot wall fell and struck him in the head. CT showing C2 bilateral pedicle fx and left lateral mass, C7 spinous process fx, T1 compression fx, and T3 body fx. S/p C6-T4 posterior pedicle fusion. No significant PMH.     PT Comments    Pt needed maximal encouragement to agree to work on sitting EOB then attempting standing.  Pt would not go to the chair even after discussion of the benefits of getting up to the chair.  Pt took 2 weak steps toward the [facility] and returned to supine    Follow Up Recommendations  Home health PT     Equipment Recommendations  Rolling walker with 5" wheels;3in1 (PT)    Recommendations for Other Services       Precautions / Restrictions Precautions Precautions: Fall;Cervical;Back Required Braces or Orthoses: Cervical Brace;Spinal Brace Cervical Brace: Hard collar;At all times Spinal Brace: Other (comment)(CTO on all the time)    Mobility  Bed Mobility Overal bed mobility: Needs Assistance Bed Mobility: Rolling;Sidelying to Sit;Sit to Sidelying Rolling: Mod assist;+2 for physical assistance;+2 for safety/equipment;Max assist Sidelying to sit: Max assist;+2 for physical assistance;+2 for safety/equipment     Sit to sidelying: Max assist;+2 for physical assistance;+2 for safety/equipment General bed mobility comments: pt educated on log roll  and need to transition from side to/from si.  Transfers Overall transfer level: Needs assistance Equipment used: Rolling walker (2 wheeled) Transfers: Sit to/from Stand Sit to Stand: Min assist;+2 safety/equipment(x2)         General transfer comment: pt was cautious of too much help, anticipating it hurting him instead/  Ambulation/Gait             General Gait  Details: side steps with RW toward [facility]   Stairs             Wheelchair Mobility    Modified Rankin (Stroke Patients Only)       Balance Overall balance assessment: Needs assistance   Sitting balance-Leahy Scale: Fair     Standing balance support: Bilateral upper extremity supported;During functional activity Standing balance-Leahy Scale: Poor Standing balance comment: Reliant on UE support                            Cognition Arousal/Alertness: Awake/alert Behavior During Therapy: WFL for tasks assessed/performed Overall Cognitive Status: Within Functional Limits for tasks assessed                                        Exercises      General Comments        Pertinent Vitals/Pain Pain Assessment: Faces Faces Pain Scale: Hurts whole lot Pain Location: Neck and upper back Pain Descriptors / Indicators: Constant;Discomfort;Grimacing Pain Intervention(s): Monitored during session;Repositioned    Home Living                      Prior Function            PT Goals (current goals can now be found in the care plan section) Acute Rehab PT Goals Patient Stated Goal: Return to home PT Goal Formulation: With patient Progress towards  PT goals: Progressing toward goals    Frequency    Min 5X/week      PT Plan Current plan remains appropriate    Co-evaluation              AM-PAC PT "6 Clicks" Mobility   Outcome Measure  Help needed turning from your back to your side while in a flat bed without using bedrails?: A Lot Help needed moving from lying on your back to sitting on the side of a flat bed without using bedrails?: Total         6 Click Score: 3    End of Session       Nurse Communication: Mobility status PT Visit Diagnosis: Difficulty in walking, not elsewhere classified (R26.2)     Time: 1340-1405 PT Time Calculation (min) (ACUTE ONLY): 25 min  Charges:  $Therapeutic Activity: 23-37  mins                     07/19/2019  Ginger Carne., PT Acute Rehabilitation Services [phone]  (pager) [phone]  (office)   Charles Perez 07/19/2019, 4:23 PM

## 2019-07-19 NOTE — Progress Notes (Signed)
Inpatient Rehabilitation Admissions Coordinator  I met at bedside with patient with his significant other, Charles Perez who speaks fluent [place]. We discussed goals and expectations of a possible inpt rehab admit. They are in agreement and prefer CIR for a few days prior to d/c home. I discussed the need for patient to work extensively with therapist here in this room to build up tolerance for more aggressive therapy. I will follow up tomorrow to assist with CIR pending bed availability this week.  Danne Baxter, RN, MSN Rehab Admissions Coordinator [phone] 07/19/2019 5:56 PM

## 2019-07-20 NOTE — Progress Notes (Signed)
Orthopedic Tech Progress Note Patient Details:  Charles Perez December 24, 1985 [id number] Called to [employer] so they could come out and make adjustments to CTO brace Patient ID: Charles Perez, male   DOB: 12/06/1985, 34 y.o.   MRN: [id number]   [name] 07/20/2019, 3:03 PM

## 2019-07-20 NOTE — Progress Notes (Signed)
Representatives from [employer] came and adjusted pt collar as Dr. Wynetta Emery requested. Pt tolerated fair. Charles Perez (wife) at bedside and interpreting for pt. It was explained why the collar needed to be refitted. He verbalized understanding. Will continue to monitor. Mayford Knife RN

## 2019-07-20 NOTE — Progress Notes (Addendum)
Occupational Therapy Treatment Patient Details Name: Charles Perez MRN: [id number] DOB: [birth year] Today's Date: 07/20/2019    History of present illness 34 yo male presenting with neck pain; pt was at work at a [employer] site when a 16 foot wall fell and struck him in the head. CT showing C2 bilateral pedicle fx and left lateral mass, C7 spinous process fx, T1 compression fx, and T3 body fx. S/p C6-T4 posterior pedicle fusion. No significant PMH.    OT comments  Pt requiring encouragement to participate in therapy today but ultimtately agreeable. Pt tolerating room level mobility using RW, and able to progress to mobilizing with one person assist during activity completion (+2 present for safety). Pt continues to be slow/guarded with mobility due to pain and requiring increased assist for bed mobility tasks. HR up to 130 with activity. Discussed option of rehab with pt/pt's significant other and both agreeable to pursuing. Feel pt will benefit to maximize his overall safety and independence with ADL/mobility prior to return home. Will continue to follow while acutely admitted.   Follow Up Recommendations  CIR;Supervision/Assistance - 24 hour    Equipment Recommendations  3 in 1 bedside commode          Precautions / Restrictions Precautions Precautions: Fall;Cervical;Back Required Braces or Orthoses: Cervical Brace;Spinal Brace Cervical Brace: Hard collar;At all times Spinal Brace: Other (comment)(on all the time--  CTO)       Mobility Bed Mobility Overal bed mobility: Needs Assistance   Rolling: Mod assist(with use of the rail) Sidelying to sit: Mod assist;+2 for physical assistance     Sit to sidelying: Mod assist;+2 for physical assistance    Transfers Overall transfer level: Needs assistance Equipment used: Rolling walker (2 wheeled) Transfers: Sit to/from Stand Sit to Stand: Min assist;+2 safety/equipment         General transfer comment: cues for hand  placement, light min assist for boost.  +2 for safety only    Balance Overall balance assessment: Needs assistance Sitting-balance support: No upper extremity supported;Feet supported Sitting balance-Leahy Scale: Fair     Standing balance support: Bilateral upper extremity supported;During functional activity Standing balance-Leahy Scale: Poor Standing balance comment: Reliant on UE support                           ADL either performed or assessed with clinical judgement   ADL Overall ADL's : Needs assistance/impaired                                     Functional mobility during ADLs: Min guard;Minimal assistance;+2 for safety/equipment;Rolling walker General ADL Comments: pt declined need to perform ADL tasks today, agreeable to mobility within room                       Cognition Arousal/Alertness: Awake/alert Behavior During Therapy: WFL for tasks assessed/performed Overall Cognitive Status: Within Functional Limits for tasks assessed                                          Exercises     Shoulder Instructions       General Comments HR rising into the 130's with activity, in the 100's at rest.  Sats remained in the lower 90's on RA  Pertinent Vitals/ Pain       Pain Assessment: 0-10 Pain Score: 10-Worst pain ever Pain Location: Neck and upper back Pain Descriptors / Indicators: Constant;Discomfort Pain Intervention(s): Monitored during session;Patient requesting pain meds-RN notified;Limited activity within patient's tolerance  Home Living                                          Prior Functioning/Environment              Frequency  Min 3X/week        Progress Toward Goals  OT Goals(current goals can now be found in the care plan section)  Progress towards OT goals: Progressing toward goals  Acute Rehab OT Goals Patient Stated Goal: Return to home OT Goal Formulation: With  patient Time For Goal Achievement: 08/01/19 Potential to Achieve Goals: Good  Plan Discharge plan needs to be updated    Co-evaluation    PT/OT/SLP Co-Evaluation/Treatment: Yes Reason for Co-Treatment: For patient/therapist safety   OT goals addressed during session: ADL's and self-care;Strengthening/ROM      AM-PAC OT "6 Clicks" Daily Activity     Outcome Measure   Help from another person eating meals?: None Help from another person taking care of personal grooming?: A Little Help from another person toileting, which includes using toliet, bedpan, or urinal?: A Lot Help from another person bathing (including washing, rinsing, drying)?: A Lot Help from another person to put on and taking off regular upper body clothing?: A Lot Help from another person to put on and taking off regular lower body clothing?: A Lot 6 Click Score: 15    End of Session Equipment Utilized During Treatment: Back brace;Cervical collar;Rolling walker  OT Visit Diagnosis: Unsteadiness on feet (R26.81);Muscle weakness (generalized) (M62.81);Other abnormalities of gait and mobility (R26.89);Pain Pain - part of body: (back, neck)   Activity Tolerance Patient tolerated treatment well   Patient Left in bed;with call bell/phone within reach;with bed alarm set;with family/visitor present   Nurse Communication Mobility status;Patient requests pain meds        Time: 2202-5427 OT Time Calculation (min): 30 min  Charges: OT General Charges $OT Visit: 1 Visit OT Treatments $Therapeutic Activity: 8-22 mins  Lou Cal, OT Acute Rehabilitation Services Pager [phone] Office [phone]    Raymondo Band 07/20/2019, 6:08 PM

## 2019-07-20 NOTE — Progress Notes (Signed)
Subjective: Patient reports Overall patient seems to be doing well condition neck pain is stable if not improving.  Arms and legs are good  Objective: Vital signs in last 24 hours: Temp:  [98.2 F (36.8 C)-99.5 F (37.5 C)] 98.6 F (37 C) (03/24 1142) Pulse Rate:  [99-110] 110 (03/24 1142) Resp:  [18-20] 18 (03/24 1142) BP: (100-126)/(65-87) 100/87 (03/24 1142) SpO2:  [47 %-98 %] 98 % (03/24 1142)  Intake/Output from previous day: 03/23 0701 - 03/24 0700 In: 1000 [P.O.:1000] Out: 2500 [Urine:1300; Drains:1200] Intake/Output this shift: Total I/O In: -  Out: 550 [Urine:550]  Strength 5 of 5 wounds clean dry and intact  Lab Results: Recent Labs    07/18/19 1939  WBC 16.2*  HGB 10.8*  HCT 29.9*  PLT 216   BMET No results for input(s): NA, K, CL, CO2, GLUCOSE, BUN, CREATININE, CALCIUM in the last 72 hours.  Studies/Results: No results found.  Assessment/Plan: Continue to work with physical occupational therapy agree with rehab placement patient's brace will need to be adjusted I have asked the nurse to contact the [facility] and [employer] to see if we can get it snugged up around his neck little better.  Patient is stable for transfer to rehab when bed becomes available  LOS: 4 days     Charles Perez P 07/20/2019, 12:36 PM

## 2019-07-20 NOTE — Progress Notes (Signed)
Physical Therapy Treatment Patient Details Name: Charles Perez MRN: [id number] DOB: 1986-01-25 Today's Date: 07/20/2019    History of Present Illness 34 yo male presenting with neck pain; pt was at work at a [employer] site when a 16 foot wall fell and struck him in the head. CT showing C2 bilateral pedicle fx and left lateral mass, C7 spinous process fx, T1 compression fx, and T3 body fx. S/p C6-T4 posterior pedicle fusion. No significant PMH.     PT Comments    Pt needing encouragement from therapist and wife to agree to therapies.  Pt reporting a high level of pain and not seeing the benefit of moving given the level of pain.  Emphasis on educating pt and wife on best techniques, bracing issues and importance of staying OOB for periods of time.  Also worked on transitions, transfers and progressing ambulation using the RW.    Follow Up Recommendations  CIR     Equipment Recommendations  Rolling walker with 5" wheels;3in1 (PT)    Recommendations for Other Services       Precautions / Restrictions Precautions Precautions: Fall;Cervical;Back Required Braces or Orthoses: Cervical Brace;Spinal Brace Cervical Brace: Hard collar;At all times Spinal Brace: Other (comment)(on all the time--  CTO)    Mobility  Bed Mobility Overal bed mobility: Needs Assistance   Rolling: Mod assist(with use of the rail) Sidelying to sit: Mod assist;+2 for physical assistance     Sit to sidelying: Mod assist;+2 for physical assistance    Transfers Overall transfer level: Needs assistance Equipment used: Rolling walker (2 wheeled) Transfers: Sit to/from Stand Sit to Stand: Min assist;+2 safety/equipment         General transfer comment: cues for hand placement, light min assist for boost.  +2 for safety only  Ambulation/Gait Ambulation/Gait assistance: Min assist;+2 safety/equipment Gait Distance (Feet): 22 Feet Assistive device: Rolling walker (2 wheeled) Gait Pattern/deviations:  Step-through pattern   Gait velocity interpretation: <1.31 ft/sec, indicative of household ambulator General Gait Details: slow and guarded   Stairs             Wheelchair Mobility    Modified Rankin (Stroke Patients Only)       Balance Overall balance assessment: Needs assistance Sitting-balance support: No upper extremity supported;Feet supported Sitting balance-Leahy Scale: Fair     Standing balance support: Bilateral upper extremity supported;During functional activity Standing balance-Leahy Scale: Poor Standing balance comment: Reliant on UE support                            Cognition Arousal/Alertness: Awake/alert Behavior During Therapy: WFL for tasks assessed/performed Overall Cognitive Status: Within Functional Limits for tasks assessed                                        Exercises      General Comments General comments (skin integrity, edema, etc.): HR rising into the 130's with activity, in the 100's at rest.  Sats remained in the lower 90's on RA      Pertinent Vitals/Pain Pain Assessment: 0-10 Pain Score: 10-Worst pain ever Pain Location: Neck and upper back Pain Descriptors / Indicators: Constant;Discomfort Pain Intervention(s): Monitored during session;Limited activity within patient's tolerance;Patient requesting pain meds-RN notified    Home Living  Prior Function            PT Goals (current goals can now be found in the care plan section) Acute Rehab PT Goals Patient Stated Goal: Return to home PT Goal Formulation: With patient Time For Goal Achievement: 08/01/19 Progress towards PT goals: Progressing toward goals    Frequency    Min 5X/week      PT Plan Current plan remains appropriate    Co-evaluation PT/OT/SLP Co-Evaluation/Treatment: Yes Reason for Co-Treatment: For patient/therapist safety          AM-PAC PT "6 Clicks" Mobility   Outcome Measure   Help needed turning from your back to your side while in a flat bed without using bedrails?: A Lot Help needed moving from lying on your back to sitting on the side of a flat bed without using bedrails?: A Lot Help needed moving to and from a bed to a chair (including a wheelchair)?: A Lot Help needed standing up from a chair using your arms (e.g., wheelchair or bedside chair)?: A Lot Help needed to walk in hospital room?: A Little Help needed climbing 3-5 steps with a railing? : A Lot 6 Click Score: 13    End of Session Equipment Utilized During Treatment: Cervical collar;Back brace Activity Tolerance: Patient limited by pain Patient left: in bed;with call bell/phone within reach;with bed alarm set Nurse Communication: Mobility status PT Visit Diagnosis: Other abnormalities of gait and mobility (R26.89);Difficulty in walking, not elsewhere classified (R26.2);Pain Pain - part of body: (neck and upper back)     Time: 4656-8127 PT Time Calculation (min) (ACUTE ONLY): 30 min  Charges:  $Gait Training: 8-22 mins                     07/20/2019  Charles Perez., PT Acute Rehabilitation Services [phone]  (pager) [phone]  (office)   Charles Perez Charles Perez 07/20/2019, 5:43 PM

## 2019-07-21 ENCOUNTER — Inpatient Hospital Stay (HOSPITAL_COMMUNITY)
Admission: RE | Admit: 2019-07-21 | Discharge: 2019-07-30 | DRG: 560 | Disposition: A | Discharge status: Home Health | Payer: Self-pay | Source: Intra-hospital | Attending: Physical Medicine and Rehabilitation | Admitting: Physical Medicine and Rehabilitation

## 2019-07-21 ENCOUNTER — Other Ambulatory Visit: Payer: Self-pay

## 2019-07-21 ENCOUNTER — Encounter (HOSPITAL_COMMUNITY): Payer: Self-pay | Admitting: Physical Medicine and Rehabilitation

## 2019-07-21 DIAGNOSIS — G8250 Quadriplegia, unspecified: Secondary | ICD-10-CM | POA: Diagnosis present

## 2019-07-21 DIAGNOSIS — R0902 Hypoxemia: Secondary | ICD-10-CM | POA: Diagnosis present

## 2019-07-21 DIAGNOSIS — S22019D Unspecified fracture of first thoracic vertebra, subsequent encounter for fracture with routine healing: Principal | ICD-10-CM

## 2019-07-21 DIAGNOSIS — G4700 Insomnia, unspecified: Secondary | ICD-10-CM | POA: Diagnosis present

## 2019-07-21 DIAGNOSIS — Z833 Family history of diabetes mellitus: Secondary | ICD-10-CM

## 2019-07-21 DIAGNOSIS — R739 Hyperglycemia, unspecified: Secondary | ICD-10-CM

## 2019-07-21 DIAGNOSIS — K592 Neurogenic bowel, not elsewhere classified: Secondary | ICD-10-CM | POA: Diagnosis present

## 2019-07-21 DIAGNOSIS — S24101D Unspecified injury at T1 level of thoracic spinal cord, subsequent encounter: Secondary | ICD-10-CM

## 2019-07-21 DIAGNOSIS — Z981 Arthrodesis status: Secondary | ICD-10-CM

## 2019-07-21 DIAGNOSIS — S22089D Unspecified fracture of T11-T12 vertebra, subsequent encounter for fracture with routine healing: Secondary | ICD-10-CM

## 2019-07-21 DIAGNOSIS — E871 Hypo-osmolality and hyponatremia: Secondary | ICD-10-CM | POA: Diagnosis not present

## 2019-07-21 DIAGNOSIS — S12100D Unspecified displaced fracture of second cervical vertebra, subsequent encounter for fracture with routine healing: Secondary | ICD-10-CM

## 2019-07-21 DIAGNOSIS — S12130A Unspecified traumatic displaced spondylolisthesis of second cervical vertebra, initial encounter for closed fracture: Secondary | ICD-10-CM

## 2019-07-21 DIAGNOSIS — D62 Acute posthemorrhagic anemia: Secondary | ICD-10-CM | POA: Diagnosis not present

## 2019-07-21 DIAGNOSIS — S12100A Unspecified displaced fracture of second cervical vertebra, initial encounter for closed fracture: Secondary | ICD-10-CM | POA: Diagnosis present

## 2019-07-21 DIAGNOSIS — S0990XA Unspecified injury of head, initial encounter: Secondary | ICD-10-CM | POA: Diagnosis present

## 2019-07-21 DIAGNOSIS — Z716 Tobacco abuse counseling: Secondary | ICD-10-CM

## 2019-07-21 DIAGNOSIS — S12600D Unspecified displaced fracture of seventh cervical vertebra, subsequent encounter for fracture with routine healing: Secondary | ICD-10-CM

## 2019-07-21 DIAGNOSIS — W201XXD Struck by object due to collapse of building, subsequent encounter: Secondary | ICD-10-CM | POA: Diagnosis present

## 2019-07-21 DIAGNOSIS — D72829 Elevated white blood cell count, unspecified: Secondary | ICD-10-CM | POA: Diagnosis not present

## 2019-07-21 DIAGNOSIS — G8918 Other acute postprocedural pain: Secondary | ICD-10-CM

## 2019-07-21 DIAGNOSIS — R000 Tachycardia, unspecified: Secondary | ICD-10-CM | POA: Diagnosis present

## 2019-07-21 DIAGNOSIS — F17210 Nicotine dependence, cigarettes, uncomplicated: Secondary | ICD-10-CM | POA: Diagnosis present

## 2019-07-21 DIAGNOSIS — E1165 Type 2 diabetes mellitus with hyperglycemia: Secondary | ICD-10-CM | POA: Diagnosis not present

## 2019-07-21 DIAGNOSIS — K5900 Constipation, unspecified: Secondary | ICD-10-CM | POA: Diagnosis present

## 2019-07-21 DIAGNOSIS — K5901 Slow transit constipation: Secondary | ICD-10-CM | POA: Diagnosis present

## 2019-07-21 DIAGNOSIS — N319 Neuromuscular dysfunction of bladder, unspecified: Secondary | ICD-10-CM | POA: Diagnosis present

## 2019-07-21 LAB — GLUCOSE, CAPILLARY: Glucose-Capillary: 284 mg/dL — ABNORMAL HIGH (ref 70–99)

## 2019-07-21 MED ORDER — OXYCODONE HCL 5 MG PO TABS
10.0000 mg | ORAL_TABLET | ORAL | Status: DC | PRN
  Administered 2019-07-21 – 2019-07-22 (×4): 10 mg via ORAL
  Filled 2019-07-21 (×4): qty 2

## 2019-07-21 MED ORDER — DOCUSATE SODIUM 100 MG PO CAPS
100.0000 mg | ORAL_CAPSULE | Freq: Two times a day (BID) | ORAL | Status: DC
  Administered 2019-07-21 – 2019-07-30 (×18): 100 mg via ORAL
  Filled 2019-07-21 (×18): qty 1

## 2019-07-21 MED ORDER — ACETAMINOPHEN 650 MG RE SUPP: 650.0000 mg | RECTAL | Status: DC | PRN

## 2019-07-21 MED ORDER — ACETAMINOPHEN 325 MG PO TABS
650.0000 mg | ORAL_TABLET | ORAL | Status: DC | PRN
  Administered 2019-07-22 – 2019-07-30 (×12): 650 mg via ORAL
  Filled 2019-07-21 (×14): qty 2

## 2019-07-21 MED ORDER — ONDANSETRON HCL 4 MG PO TABS: 4.0000 mg | ORAL_TABLET | Freq: Four times a day (QID) | ORAL | Status: DC | PRN

## 2019-07-21 MED ORDER — ACETAMINOPHEN 325 MG PO TABS: 650.0000 mg | ORAL_TABLET | Freq: Four times a day (QID) | ORAL | Status: DC | PRN

## 2019-07-21 MED ORDER — ONDANSETRON HCL 4 MG/2ML IJ SOLN: 4.0000 mg | Freq: Four times a day (QID) | INTRAMUSCULAR | Status: DC | PRN

## 2019-07-21 MED ORDER — CYCLOBENZAPRINE HCL 10 MG PO TABS
10.0000 mg | ORAL_TABLET | Freq: Three times a day (TID) | ORAL | Status: DC | PRN
  Administered 2019-07-22 – 2019-07-23 (×2): 10 mg via ORAL
  Filled 2019-07-21 (×2): qty 1

## 2019-07-21 MED ORDER — PANTOPRAZOLE SODIUM 40 MG PO TBEC
40.0000 mg | DELAYED_RELEASE_TABLET | Freq: Every day | ORAL | Status: DC
  Administered 2019-07-21 – 2019-07-29 (×9): 40 mg via ORAL
  Filled 2019-07-21 (×9): qty 1

## 2019-07-21 MED ORDER — SENNA 8.6 MG PO TABS
1.0000 | ORAL_TABLET | Freq: Two times a day (BID) | ORAL | Status: DC
  Administered 2019-07-21 – 2019-07-30 (×18): 8.6 mg via ORAL
  Filled 2019-07-21 (×18): qty 1

## 2019-07-21 MED ORDER — INSULIN ASPART 100 UNIT/ML ~~LOC~~ SOLN
0.0000 [IU] | Freq: Three times a day (TID) | SUBCUTANEOUS | Status: DC
  Administered 2019-07-22 (×3): 8 [IU] via SUBCUTANEOUS
  Administered 2019-07-23 (×2): 5 [IU] via SUBCUTANEOUS
  Administered 2019-07-23: 8 [IU] via SUBCUTANEOUS
  Administered 2019-07-24: 5 [IU] via SUBCUTANEOUS
  Administered 2019-07-24: 2 [IU] via SUBCUTANEOUS
  Administered 2019-07-24: 5 [IU] via SUBCUTANEOUS
  Administered 2019-07-25: 4 [IU] via SUBCUTANEOUS
  Administered 2019-07-25: 3 [IU] via SUBCUTANEOUS
  Administered 2019-07-25: 5 [IU] via SUBCUTANEOUS
  Administered 2019-07-26 (×3): 3 [IU] via SUBCUTANEOUS
  Administered 2019-07-27: 11 [IU] via SUBCUTANEOUS
  Administered 2019-07-27 – 2019-07-28 (×5): 3 [IU] via SUBCUTANEOUS
  Administered 2019-07-29 (×2): 2 [IU] via SUBCUTANEOUS
  Administered 2019-07-29 – 2019-07-30 (×2): 3 [IU] via SUBCUTANEOUS

## 2019-07-21 MED ORDER — ACETAMINOPHEN 650 MG RE SUPP: 650.0000 mg | Freq: Four times a day (QID) | RECTAL | Status: DC | PRN

## 2019-07-21 MED ORDER — SORBITOL 70 % SOLN: 30.0000 mL | Freq: Every day | Status: DC | PRN

## 2019-07-21 NOTE — Progress Notes (Signed)
Discussed with pt via interpreter that he needs to keep the neck brace on at all times. Pt said that he loosened it to eat and then the doctor came in and caught him. This nurse then explained that he should not take it off to eat he needs to keep it on. Pt then said that the doctor put it on too tight so he loosened it again. This nurse again told pt that he has to keep the brace tight. He said he understood but these conversations were had on the previous day shift as well. Will continue to monitor. Mayford Knife RN

## 2019-07-21 NOTE — Progress Notes (Signed)
Charles Chin, MD  Physician  Physical Medicine and Rehabilitation  Consult Note      Signed  Date of Service:  07/19/2019  8:41 AM      Related encounter: ED to Hosp-Admission (Current) from 07/16/2019 in [place] [facility]      Signed      Expand AllCollapse All   Show:Clear all [x] Manual[x] Template[] Copied  Added by: [x] Love, , PA-C[x] Raulkar, , MD  [] Hover for details          Physical Medicine and Rehabilitation Consult     Reason for Consult: SCI with functional deficits.  Referring Physician: Dr. Evlyn Kanner     HPI: Charles Perez is a 34 y.o. male who was struck by a 44' tall wall while working at a [employer] site. He was admitted for work up on 03/20 and has complaints of neck pain. He was found to have moderately displaced fracture involving bilateral pedicles and extending thorough transverse foramina, fractures of T1 vertebral body with compression deformity with marrow edema and severe compression deformity of T3 with mild to moderate canal stenosis and abnormal dorsal epidural signal from C7 to T3 question due to epidural hemorrhage. CTA head/neck without vascular injury or occlusion. Hangman's fracture was placed in cervical collar. He was taken to OR emergently for  C6-T4 arthrodesis by Dr. 12.   Post op noted to have  ABLA and hyperglycemia. To be mobilized in CTO brace and therapy evaluations showed limitations due to pain with generalized weakness as well as hypoxia with activity. MD recommending CIR due to functional deficits.       ROS Complains of pain at cervical incision site. Medication provides some relief. +constipation. Sleeping well at night. Denies fever, chills, +fatigue, denies nausea, vomiting, abdominal pain, leg pain, +voiding, weakness.      History reviewed. No pertinent past medical history.      History reviewed. No pertinent surgical history.      History reviewed. No pertinent family history.      Social History:  reports that he has been smoking cigarettes. He has never used smokeless tobacco. He reports that he does not use drugs. No history on file for alcohol.      Allergies: No Known Allergies      No medications prior to admission.      Home: Home Living Family/patient expects to be discharged to:: Private residence Living Arrangements: Spouse/significant other, Other relatives Available Help at Discharge: Family Type of Home: House Home Access: Stairs to enter 4/20 of Steps: 2 Home Layout: One level Bathroom Shower/Tub: Wynetta Emery: Standard Home Equipment: None  Functional History: Prior Function Level of Independence: Independent 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[id number] Status:  Mobility: Bed Mobility Overal bed mobility: Needs Assistance Bed Mobility: Rolling, Sidelying to Sit, Sit to Sidelying Rolling: Mod assist, +2 for physical assistance, +2 for safety/equipment, Max assist Sidelying to sit: Max assist, +2 for physical assistance, +2 for safety/equipment Sit to sidelying: Max assist, +2 for physical assistance, +2 for safety/equipment General bed mobility comments: Educating pt on log roll technique. Mod A +2 for rolling to get OOB and then Max A for lowering BLEs and elevating trunk. Max A +2 for returning to supine  Transfers Overall transfer level: Needs assistance Equipment used: Rolling walker (2 wheeled) Transfers: Sit to/from Stand Sit to Stand: Min assist, +2 physical assistance, +2 safety/equipment, From elevated surface General transfer comment: Min A +2 for power up into standing. Pt with slow  and cautious movement. Cues for hand placement Ambulation/Gait Ambulation/Gait assistance: Mod assist, +2 safety/equipment, Min assist Gait Distance (Feet): 2 Feet Assistive device: Rolling walker (2 wheeled) Gait Pattern/deviations: Step-to pattern General Gait Details: side steps with RW toward HOB     ADL: ADL Overall ADL's : Needs assistance/impaired Eating/Feeding: Sitting, Bed level, Supervision/ safety, Set up Grooming: Supervision/safety, Set up, Sitting, Bed level Upper Body Bathing: Moderate assistance, Sitting, Bed level Lower Body Bathing: Maximal assistance, Sit to/from stand, Bed level Upper Body Dressing : Maximal assistance, Sitting, Bed level Lower Body Dressing: Maximal assistance, Bed level, Sitting/lateral leans Toilet Transfer: +2 for physical assistance, +2 for safety/equipment, Minimal assistance, RW(simulated at EOB) Toilet Transfer Details (indicate cue type and reason): Min A +2 for sit<>Stand and then Min A for safety with side steps towards [facility] Functional mobility during ADLs: Minimal assistance, +2 for physical assistance, +2 for safety/equipment, Rolling walker General ADL Comments: Pt presenting with decreased strength, balance, ROM, and activity tolerance due to pain. Pt requiring increased assistance for bathing and dressing due to ROM limitations with pain and back restrictions   Cognition: Cognition Overall Cognitive Status: Within Functional Limits for tasks assessed Orientation Level: Oriented X4 Cognition Arousal/Alertness: Awake/alert Behavior During Therapy: WFL for tasks assessed/performed Overall Cognitive Status: Within Functional Limits for tasks assessed   Blood pressure (!) 111/45, pulse 100, temperature 98.5 F (36.9 C), temperature source Oral, resp. rate 20, height 5\' 9"  (1.753 m), weight 99.8 kg, SpO2 (!) 87 %.    Physical Exam General: Alert and oriented x 3, No apparent distress HEENT: Head is normocephalic, atraumatic, PERRLA, EOMI, sclera anicteric, oral mucosa pink and moist, dentition intact, ext ear canals clear, cervical collar in place Neck: Supple without JVD or lymphadenopathy Heart: Reg rate and rhythm. No murmurs rubs or gallops Chest: CTA bilaterally without wheezes, rales, or rhonchi; no distress Abdomen: Soft,  non-tender, non-distended, bowel sounds positive. Extremities: No clubbing, cyanosis, or edema. Pulses are 2+ Skin: Clean and intact without signs of breakdown Neuro: Pt is cognitively appropriate with normal insight, memory, and awareness. Cranial nerves 2-12 are intact. Sensory exam is normal. States name, month, and place. Off by 2 days on date.  Musculoskeletal: 4/5 strength diffusely, limited by fatigue.  Psych: Pt's affect is appropriate. Pt is cooperative   Lab Results Last 24 Hours       Results for orders placed or performed during the hospital encounter of 07/16/19 (from the past 24 hour(s))  CBC with Differential/Platelet     Status: Abnormal    Collection Time: 07/18/19  7:39 PM  Result Value Ref Range    WBC 16.2 (H) 4.0 - 10.5 K/uL    RBC 3.16 (L) 4.22 - 5.81 MIL/uL    Hemoglobin 10.8 (L) 13.0 - 17.0 g/dL    HCT 29.9 (L) 39.0 - 52.0 %    MCV 94.6 80.0 - 100.0 fL    MCH 34.2 (H) 26.0 - 34.0 pg    MCHC 36.1 (H) 30.0 - 36.0 g/dL    RDW 11.4 (L) 11.5 - 15.5 %    Platelets 216 150 - 400 K/uL    nRBC 0.0 0.0 - 0.2 %    Neutrophils Relative % 69 %    Neutro Abs 11.2 (H) 1.7 - 7.7 K/uL    Lymphocytes Relative 18 %    Lymphs Abs 2.9 0.7 - 4.0 K/uL    Monocytes Relative 12 %    Monocytes Absolute 1.9 (H) 0.1 - 1.0 K/uL  Eosinophils Relative 0 %    Eosinophils Absolute 0.1 0.0 - 0.5 K/uL    Basophils Relative 0 %    Basophils Absolute 0.1 0.0 - 0.1 K/uL    Immature Granulocytes 1 %    Abs Immature Granulocytes 0.12 (H) 0.00 - 0.07 K/uL       Imaging Results (Last 48 hours)  DG Thoracic Spine 1 View   Result Date: 07/17/2019 CLINICAL DATA:  C6-T6 fusion. Fluoroscopy time 3 minutes 27 seconds. EXAM: DG C-ARM 1-60 MIN; THORACIC SPINE - 1 VIEW CONTRAST:  Not applicable FLUOROSCOPY TIME:  Fluoroscopy Time:  3 minutes 27 seconds COMPARISON:  CT of the thoracic spine on 07/16/2019 FINDINGS: Four images are submitted, demonstrating surgical access and placement of transpedicular  screws. Transpedicular screws are identified at C6, C7, T1, T2, T3, T4, and T5. By history fusion extends to T6. IMPRESSION: Intraoperative images during thoracic spine fusion. Electronically Signed   By: Norva Pavlov M.D.   On: 07/17/2019 12:44    DG C-Arm 1-60 Min   Result Date: 07/17/2019 CLINICAL DATA:  C6-T6 fusion. Fluoroscopy time 3 minutes 27 seconds. EXAM: DG C-ARM 1-60 MIN; THORACIC SPINE - 1 VIEW CONTRAST:  Not applicable FLUOROSCOPY TIME:  Fluoroscopy Time:  3 minutes 27 seconds COMPARISON:  CT of the thoracic spine on 07/16/2019 FINDINGS: Four images are submitted, demonstrating surgical access and placement of transpedicular screws. Transpedicular screws are identified at C6, C7, T1, T2, T3, T4, and T5. By history fusion extends to T6. IMPRESSION: Intraoperative images during thoracic spine fusion. Electronically Signed   By: Norva Pavlov M.D.   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 Does the need for close, 24 hr/day medical supervision in concert with the patient's rehab needs make it unreasonable for this patient to be served in a less intensive setting? Yes 2. Co-Morbidities requiring supervision/potential complications: C2 bilateral pedicle fractures, left lateral mass fracture, C7 spinous process fracture, T1 compression fracture, T3 body fracture 3. Due to bladder management, bowel management, safety, skin/wound care, disease management, medication administration, pain management and patient education, does the patient require 24 hr/day rehab nursing? Yes 4. Does the patient require coordinated care of a physician, rehab nurse, therapy disciplines of PT, OT to address physical and functional deficits in the context of the above medical diagnosis(es)? Yes Addressing deficits in the following areas: balance, endurance, locomotion, strength, transferring, bowel/bladder  control, bathing, dressing, feeding, grooming, toileting and psychosocial support 5. Can the patient actively participate in an intensive therapy program of at least 3 hrs of therapy per day at least 5 days per week? Yes 6. The potential for patient to make measurable gains while on inpatient rehab is excellent 7. Anticipated functional outcomes upon discharge from inpatient rehab are modified independent  with PT, modified independent with OT, independent with SLP. 8. Estimated rehab length of stay to reach the above functional goals is: 20-22 days 9. Anticipated discharge destination: Home 10. Overall Rehab/Functional Prognosis: excellent   RECOMMENDATIONS: This patient's condition is appropriate for continued rehabilitative care in the following setting: CIR Patient has agreed to participate in recommended program. Yes Note that insurance prior authorization may be required for reimbursement for recommended care.   Comment: Mr. Mirsky would be an excellent CIR candidate. He lives with his significant other. Currently receiving IV fluids. Last received IV pain medication yesterday morning, now  on po Oxycodone and Norco. Continues to have some incision site pain and also fatigued. Appeared to have a lot of pain in PT evaluation yesterday. Can consider scheduling Tylenol as he is using sparingly.  Thank you for this consult. We will continue to follow in Mr. Nordell's care.    Jacquelynn Cree, PA-C 07/19/2019    I have personally performed a face to face diagnostic evaluation, including, but not limited to relevant history and physical exam findings, of this patient and developed relevant assessment and plan.  Additionally, I have reviewed and concur with the physician assistant's documentation above.   Sula Soda, MD        Revision History                Routing History

## 2019-07-21 NOTE — Discharge Summary (Signed)
Physician Discharge Summary Patient ID: Charles Perez MRN: [id number] DOB/AGE: 09/11/1985 33 y.o. Estimated body mass index is 32.49 kg/m as calculated from the following:   Height as of this encounter: 5\' 9"  (1.753 m).   Weight as of this encounter: 99.8 kg.   Admit date: 07/16/2019 Discharge date: 07/21/2019  Admission Diagnoses: type 1 hangman's fracture of C2 fracture dislocation of T1 and compression fracture of T3  Discharge Diagnoses: same  Active Problems:   Thoracic compression fracture [facility])   Thoracic spine fracture [facility])   Discharged Condition: good  Hospital Course:  Patient was admitted through the emergency room after sustaining an injury where wall fell on him.  He was complaining of neck pain and interscapular pain.  Workup revealed a nondisplaced type 1 hangman's fracture of C2 as well as a fracture dislocation of T1 and compression fracture of T3.  Patient was recommended cervical thoracic fixation for the T1 fracture dislocation and T3 compression fracture recommended collar for the type 1 hangman's fracture.  Patient went to surgery the next day and underwent a C6-T4 pedicle screw fixation.  Postoperatively patient went to recovery then to the floor with convalescing well up on the progressive unit getting physical therapy on a daily basis and making slow but steady improvement.  By hospital day 5 postop day 4 patient is stable for transfer to rehab.  Consults: Significant Diagnostic Studies: Treatments:  C6-T4 posterior spinal fusion with instrumentation Discharge Exam: Blood pressure (!) 155/91, pulse (!) 107, temperature 98.5 F (36.9 C), temperature source Oral, resp. rate (!) 25, height 5\' 9"  (1.753 m), weight 99.8 kg, SpO2 92 %.  awake alert strength 5/5 wound clean dry and intact  Disposition:  Rehab skilled nursing   Allergies as of 07/21/2019  No Known Allergies   Medication List  You have not been prescribed any medications.   Follow-up  Information    [facility]. Schedule an appointment as soon as possible for a visit.  Contact information: [address] [employer] [place] [phone]        Signed: [id number] 07/21/2019, 1:41 PM

## 2019-07-21 NOTE — TOC Transition Note (Signed)
Transition of Care [facility]) - CM/SW Discharge Note Donn Pierini RN,BSN Transitions of Care Unit 4NP (non trauma) - RN Case Manager [phone]   Patient Details  Name: [name] MRN: [id number] Date of Birth: [birth year]  Transition [facility]) CM/SW Contact:  Darrold Span, RN Phone Number: 07/21/2019, 1:49 PM   Clinical Narrative:    Pt stable for transition to [facility] INPT rehab today. Per Donald Siva with INPT rehab- pt has bed available today and they can admit pt this afternoon. Pt agreeable and MD has cleared pt. Will plan to transition pt later this afternoon to [facility] INPT rehab.    Final next level of care: IP Rehab Facility Barriers to Discharge: Barriers Resolved   Patient Goals and CMS Choice Patient states their goals for this hospitalization and ongoing recovery are:: rehab then home      Discharge Placement                 [facility] INPT rehab      Discharge Plan and Services                                     Social Determinants of Health (SDOH) Interventions     Readmission Risk Interventions Readmission Risk Prevention Plan 07/21/2019  Post Dischage Appt (No Data)  Medication Screening Complete

## 2019-07-21 NOTE — Progress Notes (Signed)
Inpatient Rehabilitation Admissions Coordinator  I have CIR bed available to admit pt to today. I met with patient at bedside with his wife, Benjamine Mola on speaker phone and they are in agreement to admit. Benjamine Mola translating for her spouse. I will contact Dr. Saintclair Halsted for d/c order and have made Steffanie Dunn, RN CM aware of plan.  Danne Baxter, RN, MSN Rehab Admissions Coordinator [phone] 07/21/2019 1:37 PM

## 2019-07-21 NOTE — PMR Pre-admission (Signed)
PMR Admission Coordinator Pre-Admission Assessment  Patient: Charles Perez is an 34 y.o., male MRN: [id number] DOB: May 02, 1985 Height: 5\' 9"  (175.3 cm) Weight: 99.8 kg              Insurance Information HMO:     PPO:      PCP:      IPA:      80/20:      OTHER:  PRIMARY: uninsured       , Artist, is following [phone]. Not [phone] citizen, [place]  Medicaid Application Date:       Case Manager:  Disability Application Date:       Case Worker:   The "Data Collection Information Summary" for patients in Inpatient Rehabilitation Facilities with attached "Privacy Act Statement-Health Care Records" was provided and verbally reviewed with: N/A  Emergency Contact Information Contact Information    Name Relation Home Work Mobile   FABRICE, DYAL Significant other   [phone]     Current Medical History  Patient Admitting Diagnosis: SCI  History of Present Illness:  Charles Perez is a 34 year old non-English-speaking male on no prescription medications with history of tobacco abuse as well as diabetes mellitus on no present medications.  Presented 07/16/2019 after a 16 foot wall collapsed on him while at work.  He did have transient loss of consciousness.  Complaints of back neck and upper back pain.  Admission chemistries unremarkable except WBC 19,800, glucose 299, sodium 132.  Cranial CT scan negative.  CT cervical spine showed mildly displaced fracture seen involving the bilateral pedicles of C2.  Moderately displaced fracture also seen involving the left lateral mass of C2 with disruption of the left transverse foramina.  Fracture also seen extending through the right transverse foramen.  There was some concern for vertebral artery injury.  Moderately displaced fracture seen involving the posterior spinous process of C7 with mildly displaced fracture involving distal tip of spinous process of T11.  Moderately compressed T1 vertebral body fracture noted with  possible posterior displacement of fracture fragment into spinal canal.  CTA head and neck without vascular injury or occlusion.  Patient underwent posterior cervical thoracic fixation C6-T4 utilizing the globus Creo set with the cortex posterior cervical set and posterior lateral arthrodesis from C6-T4 as well as placement of lateral mass screws C6 pedicle screws at C7, T1-T2-T3 and T4 bilaterally 07/17/2019 per Dr. 07/19/2019.  Cervical collar at all times.  Tolerating a regular diet.    Past Medical History  Past Medical History:  Diagnosis Date  . Back pain due to injury   . Diabetes (HCC)     Family History  family history includes Cancer in his mother; Diabetes in his father.  Prior Rehab/Hospitalizations:  Has the patient had prior rehab or hospitalizations prior to admission? Yes  Has the patient had major surgery during 100 days prior to admission? Yes  Current Medications   Current Facility-Administered Medications:  .  0.9 %  sodium chloride infusion, , Intravenous, Continuous, Meyran, Wynetta Emery, NP, Stopped at 07/18/19 0700 .  0.9 %  sodium chloride infusion, 250 mL, Intravenous, Continuous, 07/20/19, MD .  acetaminophen (TYLENOL) tablet 650 mg, 650 mg, Oral, Q6H PRN, 650 mg at 07/19/19 0029 **OR** acetaminophen (TYLENOL) suppository 650 mg, 650 mg, Rectal, Q6H PRN, Meyran, 07/21/19, NP .  acetaminophen (TYLENOL) tablet 650 mg, 650 mg, Oral, Q4H PRN, 650 mg at 07/21/19 0201 **OR** acetaminophen (TYLENOL) suppository 650 mg, 650 mg, Rectal, Q4H PRN, 07/23/19, MD .  alum & mag hydroxide-simeth (MAALOX/MYLANTA) 200-200-20 MG/5ML suspension 30 mL, 30 mL, Oral, Q6H PRN, Donalee Citrin, MD .  Chlorhexidine Gluconate Cloth 2 % PADS 6 each, 6 each, Topical, Daily, Donalee Citrin, MD, 6 each at 07/19/19 1000 .  cyclobenzaprine (FLEXERIL) tablet 10 mg, 10 mg, Oral, TID PRN, Donalee Citrin, MD, 10 mg at 07/21/19 0433 .  dextrose 5 % and 0.45 % NaCl with KCl 20 mEq/L infusion, , Intravenous,  Continuous, Donalee Citrin, MD, Stopped at 07/19/19 0800 .  docusate sodium (COLACE) capsule 100 mg, 100 mg, Oral, BID, Meyran, Tiana Loft, NP, 100 mg at 07/20/19 2049 .  HYDROcodone-acetaminophen (NORCO/VICODIN) 5-325 MG per tablet 1-2 tablet, 1-2 tablet, Oral, Q4H PRN, Meyran, Tiana Loft, NP, 2 tablet at 07/20/19 1605 .  HYDROmorphone (DILAUDID) injection 0.5 mg, 0.5 mg, Intravenous, Q2H PRN, Donalee Citrin, MD, 0.5 mg at 07/17/19 2154 .  HYDROmorphone (DILAUDID) injection 1 mg, 1 mg, Intravenous, Q4H PRN, Meyran, Tiana Loft, NP, 1 mg at 07/20/19 0531 .  menthol-cetylpyridinium (CEPACOL) lozenge 3 mg, 1 lozenge, Oral, PRN **OR** phenol (CHLORASEPTIC) mouth spray 1 spray, 1 spray, Mouth/Throat, PRN, Donalee Citrin, MD .  ondansetron (ZOFRAN) tablet 4 mg, 4 mg, Oral, Q6H PRN **OR** ondansetron (ZOFRAN) injection 4 mg, 4 mg, Intravenous, Q6H PRN, Meyran, Tiana Loft, NP .  ondansetron (ZOFRAN) tablet 4 mg, 4 mg, Oral, Q6H PRN **OR** ondansetron (ZOFRAN) injection 4 mg, 4 mg, Intravenous, Q6H PRN, Donalee Citrin, MD .  oxyCODONE (Oxy IR/ROXICODONE) immediate release tablet 10 mg, 10 mg, Oral, Q3H PRN, Donalee Citrin, MD, 10 mg at 07/21/19 0756 .  pantoprazole (PROTONIX) EC tablet 40 mg, 40 mg, Oral, QHS, Pierce, Dwayne A, RPH, 40 mg at 07/20/19 2049 .  senna (SENOKOT) tablet 8.6 mg, 1 tablet, Oral, BID, Meyran, Tiana Loft, NP, 8.6 mg at 07/20/19 2048 .  sodium chloride flush (NS) 0.9 % injection 3 mL, 3 mL, Intravenous, PRN, Donalee Citrin, MD  Patients Current Diet:  Diet Order            Diet regular Room service appropriate? Yes; Fluid consistency: Thin  Diet effective now              Precautions / Restrictions Precautions Precautions: Fall, Cervical, Back Precaution Booklet Issued: No Cervical Brace: Hard collar, At all times Spinal Brace: Other (comment)(on all the time--  CTO) Restrictions Weight Bearing Restrictions: Yes   Has the patient had 2 or more falls or a fall with  injury in the past year?No  Prior Activity Level Community (5-7x/wk): independent, driving and self employed  Prior Functional Level Prior Function Level of Independence: Independent 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Presenter, broadcasting Care: Did the patient need help bathing, dressing, using the toilet or eating?  Independent  Indoor Mobility: Did the patient need assistance with walking from room to room (with or without device)? Independent  Stairs: Did the patient need assistance with internal or external stairs (with or without device)? Independent  Functional Cognition: Did the patient need help planning regular tasks such as shopping or remembering to take medications? Independent  Home Assistive Devices / Equipment Home Assistive Devices/Equipment: None Home Equipment: None  Prior Device Use: Indicate devices/aids used by the patient prior to current illness, exacerbation or injury? None of the above  Current Functional Level Cognition  Overall Cognitive Status: Within Functional Limits for tasks assessed Orientation Level: Oriented X4    Extremity Assessment (includes Sensation/Coordination)  Upper Extremity Assessment: Defer to OT evaluation  Lower Extremity Assessment: Overall [facility]  for tasks assessed    ADLs  Overall ADL's : Needs assistance/impaired Eating/Feeding: Sitting, Bed level, Supervision/ safety, Set up Grooming: Supervision/safety, Set up, Sitting, Bed level Upper Body Bathing: Moderate assistance, Sitting, Bed level Lower Body Bathing: Maximal assistance, Sit to/from stand, Bed level Upper Body Dressing : Maximal assistance, Sitting, Bed level Lower Body Dressing: Maximal assistance, Bed level, Sitting/lateral leans Toilet Transfer: +2 for physical assistance, +2 for safety/equipment, Minimal assistance, RW(simulated at EOB) Toilet Transfer Details (indicate cue type and reason): Min A +2 for sit<>Stand and then Min A for safety with side steps towards  HOB Functional mobility during ADLs: Min guard, Minimal assistance, +2 for safety/equipment, Rolling walker General ADL Comments: pt declined need to perform ADL tasks today, agreeable to mobility within room    Mobility  Overal bed mobility: Needs Assistance Bed Mobility: Rolling, Sidelying to Sit, Sit to Sidelying Rolling: Mod assist(with use of the rail) Sidelying to sit: Mod assist, +2 for physical assistance Sit to sidelying: Mod assist, +2 for physical assistance General bed mobility comments: pt educated on log roll  and need to transition from side to/from si.    Transfers  Overall transfer level: Needs assistance Equipment used: Rolling walker (2 wheeled) Transfers: Sit to/from Stand Sit to Stand: Min assist, +2 safety/equipment General transfer comment: cues for hand placement, light min assist for boost.  +2 for safety only    Ambulation / Gait / Stairs / Wheelchair Mobility  Ambulation/Gait Ambulation/Gait assistance: Min assist, +2 safety/equipment Gait Distance (Feet): 22 Feet Assistive device: Rolling walker (2 wheeled) Gait Pattern/deviations: Step-through pattern General Gait Details: slow and guarded Gait velocity interpretation: <1.31 ft/sec, indicative of household ambulator    Posture / Balance Balance Overall balance assessment: Needs assistance Sitting-balance support: No upper extremity supported, Feet supported Sitting balance-Leahy Scale: Fair Standing balance support: Bilateral upper extremity supported, During functional activity Standing balance-Leahy Scale: Poor Standing balance comment: Reliant on UE support    Special needs/care consideration BiPAP/CPAP n/a CPM n/a Continuous Drip IV n/a Dialysis n/a Life Vest n/a Oxygen n/a Special Bed n/a Trach Size n/a Wound Vac n/a Skin surgical incision with drain to be removed 3/25 Bowel mgmt: no LBM documented Bladder mgmt:continent Diabetic mgmt yes on no meds or PCP pta per wife Behavioral  consideration  N/a Chemo/radiation  N/a Spanish speaker; understand some Sales executive is wife, Lanora Manis   Previous Home Environment  Living Arrangements: Spouse/significant other(and wife's 80 year old son)  Lives With: Significant other, Son Available Help at Discharge: Family, Available 24 hours/day(Elizabeth works managing a Cytogeneticist and 30 year old son of E) Type of Home: House Home Layout: One level Home Access: Stairs to enter Secretary/administrator of Steps: 2 Bathroom Shower/Tub: Engineer, manufacturing systems: Standard Bathroom Accessibility: Yes How Accessible: Accessible via walker Home Care Services: No  Discharge Living Setting Plans for Discharge Living Setting: Patient's home, Lives with (comment)(Elizabeth and her 23 year old son) Type of Home at Discharge: House Discharge Home Layout: One level Discharge Home Access: Stairs to enter Entrance Stairs-Rails: None Entrance Stairs-Number of Steps: 2 Discharge Bathroom Shower/Tub: Tub/shower unit Discharge Bathroom Toilet: Standard Does the patient have any problems obtaining your medications?: No  Social/Family/Support Systems Patient Roles: Spouse Contact Information: Lanora Manis Anticipated Caregiver: Lanora Manis and 59 year old son Anticipated Caregiver's Contact Information: [phone] Ability/Limitations of Caregiver: Lanora Manis works but son in school remotely from home Caregiver Availability: 24/7 Discharge Plan Discussed with Primary Caregiver: Yes Is Caregiver In Agreement with Plan?: Yes Does  Caregiver/Family have Issues with Lodging/Transportation while Pt is in Rehab?: No  Goals/Additional Needs Patient/Family Goal for Rehab: Mod I to supervision PT and OT Expected length of stay: [facility] 2 weeks Cultural Considerations: SPanish speaking Special Service Needs: spanish interpretor needed  Pt/Family Agrees to Admission and willing to participate: Yes Program Orientation Provided &  Reviewed with Pt/Caregiver Including Roles  & Responsibilities: Yes  Decrease burden of Care through IP rehab admission: n/a  Possible need for SNF placement upon discharge: n/a  Patient Condition: This patient's medical and functional status has changed since the consult dated: 07/19/2019 in which the Rehabilitation Physician determined and documented that the patient's condition is appropriate for intensive rehabilitative care in an inpatient rehabilitation facility. See "History of Present Illness" (above) for medical update. Functional changes are: mod assist. Patient's medical and functional status update has been discussed with the Rehabilitation physician and patient remains appropriate for inpatient rehabilitation. Will admit to inpatient rehab today.  Preadmission Screen Completed By:  Cleatrice Burke, RN, 07/21/2019 1:40 PM ______________________________________________________________________   Discussed status with Dr. Dagoberto Ligas on 07/21/2019 at 1340 and received approval for admission today.  Admission Coordinator:  Cleatrice Burke, time [id number] Date 07/21/2019

## 2019-07-21 NOTE — Progress Notes (Signed)
Cristina Gong, RN  Rehab Admission Coordinator  Physical Medicine and Rehabilitation  PMR Pre-admission  Signed  Date of Service:  07/21/2019  1:19 PM      Related encounter: ED to Hosp-Admission (Current) from 07/16/2019 in [facility]        Show:Clear all [x] Manual[x] Template[x] Copied  Added by: [x] Cristina Gong, RN  [] Hover for details PMR Admission Coordinator Pre-Admission Assessment   Patient: Charles Perez is an 34 y.o., male MRN: [id number] DOB: [birth year] Height: 5\' 9"  (175.3 cm) Weight: 99.8 kg                                                                                                                                                  Insurance Information HMO:     PPO:      PCP:      IPA:      80/20:      OTHER:  PRIMARY: uninsured        Development worker, community, Merrily Brittle, is following [phone]. Not [place] citizen, [place]   Medicaid Application Date:       Case Manager:  Disability Application Date:       Case Worker:    The "Data Collection Information Summary" for patients in Inpatient Rehabilitation Facilities with attached "Privacy Act [place] Records" was provided and verbally reviewed with: N/A   Emergency Contact Information         Contact Information     Name Relation Home Work [place], [place] Significant other     [phone]       Current Medical History  Patient Admitting Diagnosis: SCI   History of Present Illness:  Charles Perez is a 34 year old non-English-speaking male on no prescription medications with history of tobacco abuse as well as diabetes mellitus on no present medications.  Presented 07/16/2019 after a 16 foot wall collapsed on him while at work.  He did have transient loss of consciousness.  Complaints of back neck and upper back pain.  Admission chemistries unremarkable except WBC 19,800, glucose 299, sodium 132.  Cranial CT scan negative.  CT  cervical spine showed mildly displaced fracture seen involving the bilateral pedicles of C2.  Moderately displaced fracture also seen involving the left lateral mass of C2 with disruption of the left transverse foramina.  Fracture also seen extending through the right transverse foramen.  There was some concern for vertebral artery injury.  Moderately displaced fracture seen involving the posterior spinous process of C7 with mildly displaced fracture involving distal tip of spinous process of T11.  Moderately compressed T1 vertebral body fracture noted with possible posterior displacement of fracture fragment into spinal canal.  CTA head and neck without vascular injury or occlusion.  Patient underwent posterior cervical thoracic fixation C6-T4 utilizing the globus Creo  set with the cortex posterior cervical set and posterior lateral arthrodesis from C6-T4 as well as placement of lateral mass screws C6 pedicle screws at C7, T1-T2-T3 and T4 bilaterally 07/17/2019 per Dr. Wynetta Emery.  Cervical collar at all times.  Tolerating a regular diet.     Past Medical History      Past Medical History:  Diagnosis Date  . Back pain due to injury    . Diabetes (HCC)        Family History  family history includes Cancer in his mother; Diabetes in his father.   Prior Rehab/Hospitalizations:  Has the patient had prior rehab or hospitalizations prior to admission? Yes   Has the patient had major surgery during 100 days prior to admission? Yes   Current Medications    Current Facility-Administered Medications:  .  0.9 %  sodium chloride infusion, , Intravenous, Continuous, Meyran, Tiana Loft, NP, Stopped at 07/18/19 0700 .  0.9 %  sodium chloride infusion, 250 mL, Intravenous, Continuous, Donalee Citrin, MD .  acetaminophen (TYLENOL) tablet 650 mg, 650 mg, Oral, Q6H PRN, 650 mg at 07/19/19 0029 **OR** acetaminophen (TYLENOL) suppository 650 mg, 650 mg, Rectal, Q6H PRN, Meyran, Tiana Loft, NP .  acetaminophen  (TYLENOL) tablet 650 mg, 650 mg, Oral, Q4H PRN, 650 mg at 07/21/19 0201 **OR** acetaminophen (TYLENOL) suppository 650 mg, 650 mg, Rectal, Q4H PRN, Donalee Citrin, MD .  alum & mag hydroxide-simeth (MAALOX/MYLANTA) 200-200-20 MG/5ML suspension 30 mL, 30 mL, Oral, Q6H PRN, Donalee Citrin, MD .  Chlorhexidine Gluconate Cloth 2 % PADS 6 each, 6 each, Topical, Daily, Donalee Citrin, MD, 6 each at 07/19/19 1000 .  cyclobenzaprine (FLEXERIL) tablet 10 mg, 10 mg, Oral, TID PRN, Donalee Citrin, MD, 10 mg at 07/21/19 0433 .  dextrose 5 % and 0.45 % NaCl with KCl 20 mEq/L infusion, , Intravenous, Continuous, Donalee Citrin, MD, Stopped at 07/19/19 0800 .  docusate sodium (COLACE) capsule 100 mg, 100 mg, Oral, BID, Meyran, Tiana Loft, NP, 100 mg at 07/20/19 2049 .  HYDROcodone-acetaminophen (NORCO/VICODIN) 5-325 MG per tablet 1-2 tablet, 1-2 tablet, Oral, Q4H PRN, Meyran, Tiana Loft, NP, 2 tablet at 07/20/19 1605 .  HYDROmorphone (DILAUDID) injection 0.5 mg, 0.5 mg, Intravenous, Q2H PRN, Donalee Citrin, MD, 0.5 mg at 07/17/19 2154 .  HYDROmorphone (DILAUDID) injection 1 mg, 1 mg, Intravenous, Q4H PRN, Meyran, Tiana Loft, NP, 1 mg at 07/20/19 0531 .  menthol-cetylpyridinium (CEPACOL) lozenge 3 mg, 1 lozenge, Oral, PRN **OR** phenol (CHLORASEPTIC) mouth spray 1 spray, 1 spray, Mouth/Throat, PRN, Donalee Citrin, MD .  ondansetron (ZOFRAN) tablet 4 mg, 4 mg, Oral, Q6H PRN **OR** ondansetron (ZOFRAN) injection 4 mg, 4 mg, Intravenous, Q6H PRN, Meyran, Tiana Loft, NP .  ondansetron (ZOFRAN) tablet 4 mg, 4 mg, Oral, Q6H PRN **OR** ondansetron (ZOFRAN) injection 4 mg, 4 mg, Intravenous, Q6H PRN, Donalee Citrin, MD .  oxyCODONE (Oxy IR/ROXICODONE) immediate release tablet 10 mg, 10 mg, Oral, Q3H PRN, Donalee Citrin, MD, 10 mg at 07/21/19 0756 .  pantoprazole (PROTONIX) EC tablet 40 mg, 40 mg, Oral, QHS, Pierce, Dwayne A, RPH, 40 mg at 07/20/19 2049 .  senna (SENOKOT) tablet 8.6 mg, 1 tablet, Oral, BID, Meyran, Tiana Loft, NP, 8.6 mg  at 07/20/19 2048 .  sodium chloride flush (NS) 0.9 % injection 3 mL, 3 mL, Intravenous, PRN, Donalee Citrin, MD   Patients Current Diet:     Diet Order  Diet regular Room service appropriate? 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  Independent   Indoor Mobility: Did the patient need assistance with walking from room to room (with or without device)? Independent   Stairs: Did the patient need assistance with internal or external stairs (with or without device)? Independent   Functional Cognition: Did the patient need help planning regular tasks such as shopping or remembering to take medications? Independent   Home Assistive Devices / Equipment Home Assistive Devices/Equipment: None Home Equipment: None   Prior Device Use: Indicate devices/aids used by the patient prior to current illness, exacerbation or injury? None of the above   Current Functional Level Cognition   Overall Cognitive Status: Within Functional Limits for tasks assessed Orientation Level: Oriented X4    Extremity Assessment (includes Sensation/Coordination)   Upper Extremity Assessment: Defer to OT evaluation  Lower Extremity Assessment: Overall WFL for tasks assessed     ADLs   Overall ADL's : Needs assistance/impaired Eating/Feeding: Sitting, Bed level,  Supervision/ safety, Set up Grooming: Supervision/safety, Set up, Sitting, Bed level Upper Body Bathing: Moderate assistance, Sitting, Bed level Lower Body Bathing: Maximal assistance, Sit to/from stand, Bed level Upper Body Dressing : Maximal assistance, Sitting, Bed level Lower Body Dressing: Maximal assistance, Bed level, Sitting/lateral leans Toilet Transfer: +2 for physical assistance, +2 for safety/equipment, Minimal assistance, RW(simulated at EOB) Toilet Transfer Details (indicate cue type and reason): Min A +2 for sit<>Stand and then Min A for safety with side steps towards HOB Functional mobility during ADLs: Min guard, Minimal assistance, +2 for safety/equipment, Rolling walker General ADL Comments: pt declined need to perform ADL tasks today, agreeable to mobility within room     Mobility   Overal bed mobility: Needs Assistance Bed Mobility: Rolling, Sidelying to Sit, Sit to Sidelying Rolling: Mod assist(with use of the rail) Sidelying to sit: Mod assist, +2 for physical assistance Sit to sidelying: Mod assist, +2 for physical assistance General bed mobility comments: pt educated on log roll  and need to transition from side to/from si.     Transfers   Overall transfer level: Needs assistance Equipment used: Rolling walker (2 wheeled) Transfers: Sit to/from Stand Sit to Stand: Min assist, +2 safety/equipment General transfer comment: cues for hand placement, light min assist for boost.  +2 for safety only     Ambulation / Gait / Stairs / Wheelchair Mobility   Ambulation/Gait Ambulation/Gait assistance: Min assist, +2 safety/equipment Gait Distance (Feet): 22 Feet Assistive device: Rolling walker (2 wheeled) Gait Pattern/deviations: Step-through pattern General Gait Details: slow and guarded Gait velocity interpretation: <1.31 ft/sec, indicative of household ambulator     Posture / Balance Balance Overall balance assessment: Needs assistance Sitting-balance support: No  upper extremity supported, Feet supported Sitting balance-Leahy Scale: Fair Standing balance support: Bilateral upper extremity supported, During functional activity Standing balance-Leahy  Scale: Poor Standing balance comment: Reliant on UE support     Special needs/care consideration BiPAP/CPAP n/a CPM n/a Continuous Drip IV n/a Dialysis n/a Life Vest n/a Oxygen n/a Special Bed n/a Trach Size n/a Wound Vac n/a Skin surgical incision with drain to be removed 3/25 Bowel mgmt: no LBM documented Bladder mgmt:continent Diabetic mgmt yes on no meds or PCP pta per wife Behavioral consideration  N/a Chemo/radiation  N/a Spanish speaker; understand some Sales executive is wife, Lanora Manis    Previous Home Environment  Living Arrangements: Spouse/significant other(and wife's 58 year old son)  Lives With: Significant other, Son Available Help at Discharge: Family, Available 24 hours/day(Elizabeth works managing a Cytogeneticist and 32 year old son of E) Type of Home: House Home Layout: One level Home Access: Stairs to enter Secretary/administrator of Steps: 2 Bathroom Shower/Tub: Engineer, manufacturing systems: Standard Bathroom Accessibility: Yes How Accessible: Accessible via walker Home Care Services: No   Discharge Living Setting Plans for Discharge Living Setting: Patient's home, Lives with (comment)(Elizabeth and her 70 year old son) Type of Home at Discharge: House Discharge Home Layout: One level Discharge Home Access: Stairs to enter Entrance Stairs-Rails: None Entrance Stairs-Number of Steps: 2 Discharge Bathroom Shower/Tub: Tub/shower unit Discharge Bathroom Toilet: Standard Does the patient have any problems obtaining your medications?: No   Social/Family/Support Systems Patient Roles: Spouse Contact Information: Lanora Manis Anticipated Caregiver: Lanora Manis and 32 year old son Anticipated Caregiver's Contact Information: [phone] Ability/Limitations of  Caregiver: Lanora Manis works but son in school remotely from home Caregiver Availability: 24/7 Discharge Plan Discussed with Primary Caregiver: Yes Is Caregiver In Agreement with Plan?: Yes Does Caregiver/Family have Issues with Lodging/Transportation while Pt is in Rehab?: No   Goals/Additional Needs Patient/Family Goal for Rehab: Mod I to supervision PT and OT Expected length of stay: ELOS 2 weeks Cultural Considerations: SPanish speaking Special Service Needs: spanish interpretor needed  Pt/Family Agrees to Admission and willing to participate: Yes Program Orientation Provided & Reviewed with Pt/Caregiver Including Roles  & Responsibilities: Yes   Decrease burden of Care through IP rehab admission: n/a   Possible need for SNF placement upon discharge: n/a   Patient Condition: This patient's medical and functional status has changed since the consult dated: 07/19/2019 in which the Rehabilitation Physician determined and documented that the patient's condition is appropriate for intensive rehabilitative care in an inpatient rehabilitation facility. See "History of Present Illness" (above) for medical update. Functional changes are: mod assist. Patient's medical and functional status update has been discussed with the Rehabilitation physician and patient remains appropriate for inpatient rehabilitation. Will admit to inpatient rehab today.   Preadmission Screen Completed By:  Clois Dupes, RN, 07/21/2019 1:40 PM ______________________________________________________________________   Discussed status with Dr. Berline Chough on 07/21/2019 at 1340 and received approval for admission today.   Admission Coordinator:  Clois Dupes, time 9211 Date 07/21/2019        Cosigned by: Genice Rouge, MD at 07/21/2019  2:27 PM  Revision History

## 2019-07-21 NOTE — Progress Notes (Signed)
Subjective: Patient reports Condition neck pain stable  Objective: Vital signs in last 24 hours: Temp:  [98.6 F (37 C)-99.7 F (37.6 C)] 99.7 F (37.6 C) (03/25 0340) Pulse Rate:  [105-111] 111 (03/25 0400) Resp:  [16-24] 19 (03/25 0400) BP: (100-131)/(68-87) 121/75 (03/25 0400) SpO2:  [92 %-98 %] 95 % (03/25 0400)  Intake/Output from previous day: 03/24 0701 - 03/25 0700 In: -  Out: 900 [Urine:900] Intake/Output this shift: No intake/output data recorded.  Strength 5-5 wound clean dry and intact  Lab Results: Recent Labs    07/18/19 1939  WBC 16.2*  HGB 10.8*  HCT 29.9*  PLT 216   BMET No results for input(s): NA, K, CL, CO2, GLUCOSE, BUN, CREATININE, CALCIUM in the last 72 hours.  Studies/Results: No results found.  Assessment/Plan: Postop day 4 posterior cervical fusion mobilize with physical Occupational Therapy no drain output recorded for the last 48 hours we will DC his drain is not much in the basket.  Physical Occupational Therapy rehab placement  LOS: 5 days     Charles Perez P 07/21/2019, 7:45 AM

## 2019-07-21 NOTE — Progress Notes (Signed)
Inpatient Rehabilitation Medication Review by a Pharmacist  A complete drug regimen review was completed for this patient to identify any potential clinically significant medication issues.  Clinically significant medication issues were identified:  no  Pharmacist comments: No issues  Time spent performing this drug regimen review (minutes):  5   Tama Headings 07/21/2019 7:49 PM

## 2019-07-21 NOTE — Plan of Care (Signed)
  Problem: Consults Goal: RH GENERAL PATIENT EDUCATION Description: See Patient Education module for education specifics. Outcome: Progressing Goal: Skin Care Protocol Initiated - if Braden Score 18 or less Description: If consults are not indicated, leave blank or document N/A Outcome: Progressing Goal: Nutrition Consult-if indicated Outcome: Progressing Goal: Diabetes Guidelines if Diabetic/Glucose > 140 Description: If diabetic or lab glucose is > 140 mg/dl - Initiate Diabetes/Hyperglycemia Guidelines & Document Interventions  Outcome: Progressing   Problem: RH BOWEL ELIMINATION Goal: RH STG MANAGE BOWEL WITH ASSISTANCE Description: STG Manage Bowel with Assistance. Outcome: Progressing Goal: RH STG MANAGE BOWEL W/MEDICATION W/ASSISTANCE Description: STG Manage Bowel with Medication with Assistance. Outcome: Progressing   Problem: RH BLADDER ELIMINATION Goal: RH STG MANAGE BLADDER WITH ASSISTANCE Description: STG Manage Bladder With Assistance Outcome: Progressing Goal: RH STG MANAGE BLADDER WITH MEDICATION WITH ASSISTANCE Description: STG Manage Bladder With Medication With Assistance. Outcome: Progressing Goal: RH STG MANAGE BLADDER WITH EQUIPMENT WITH ASSISTANCE Description: STG Manage Bladder With Equipment With Assistance Outcome: Progressing   Problem: RH SKIN INTEGRITY Goal: RH STG SKIN FREE OF INFECTION/BREAKDOWN Outcome: Progressing Goal: RH STG MAINTAIN SKIN INTEGRITY WITH ASSISTANCE Description: STG Maintain Skin Integrity With Assistance. Outcome: Progressing Goal: RH STG ABLE TO PERFORM INCISION/WOUND CARE W/ASSISTANCE Description: STG Able To Perform Incision/Wound Care With Assistance. Outcome: Progressing   Problem: RH SAFETY Goal: RH STG ADHERE TO SAFETY PRECAUTIONS W/ASSISTANCE/DEVICE Description: STG Adhere to Safety Precautions With Assistance/Device. Outcome: Progressing Goal: RH STG DECREASED RISK OF FALL WITH ASSISTANCE Description: STG  Decreased Risk of Fall With Assistance. Outcome: Progressing   Problem: RH PAIN MANAGEMENT Goal: RH STG PAIN MANAGED AT OR BELOW PT'S PAIN GOAL Outcome: Progressing   Problem: RH KNOWLEDGE DEFICIT GENERAL Goal: RH STG INCREASE KNOWLEDGE OF SELF CARE AFTER HOSPITALIZATION Outcome: Progressing   Problem: RH Vision Goal: RH LTG Vision (Specify) Outcome: Progressing   Problem: RH Pre-functional/Other (Specify) Goal: RH LTG Pre-functional (Specify) Outcome: Progressing Goal: RH LTG Interdisciplinary (Specify) 1 Description: RH LTG Interdisciplinary (Specify)1 Outcome: Progressing Goal: RH LTG Interdisciplinary (Specify) 2 Description: RH LTG Interdisciplinary (Specify) 2  Outcome: Progressing   

## 2019-07-21 NOTE — H&P (Signed)
Physical Medicine and Rehabilitation Admission H&P    Chief Complaint  Patient presents with  . Head Injury  : HPI: Charles Perez is a 34 year old non-English-speaking male on no prescription medications with history of tobacco abuse as well as diabetes mellitus on no present medications.  Per chart review lives with his girlfriend and her 20 year old son.  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  Presented 07/16/2019 after a 16 foot wall collapsed on him while at work.  He did have transient loss of consciousness.  Complaints of back neck and upper back pain.  Admission chemistries unremarkable except WBC 19,800, glucose 299, sodium 132.  Cranial CT scan negative.  CT cervical spine showed mildly displaced fracture seen involving the bilateral pedicles of C2.  Moderately displaced fracture also seen involving the left lateral mass of C2 with disruption of the left transverse foramina.  Fracture also seen extending through the right transverse foramen.  There was some concern for vertebral artery injury.  Moderately displaced fracture seen involving the posterior spinous process of C7 with mildly displaced fracture involving distal tip of spinous process of T11.  Moderately compressed T1 vertebral body fracture noted with possible posterior displacement of fracture fragment into spinal canal.  CTA head and neck without vascular injury or occlusion.  Hangman fracture and patient underwent posterior cervical thoracic fixation C6-T4 utilizing the globus Creo set with the cortex posterior cervical set and posterior lateral arthrodesis from C6-T4 as well as placement of lateral mass screws C6 pedicle screws at C7, T1-T2-T3 and T4 bilaterally 07/17/2019 per Dr. Saintclair Halsted.  CTO collar at all times.  Tolerating a regular diet.  Therapy evaluations completed and patient was admitted for a comprehensive rehab program.  Hasn't had any BM lately.  Does void- but hasn't had  any PVRs.  Says pain is 8-9/10 currently from moving around prior to me seeing him with therapy - usually meds work for him.   Also c/o L antecubital IV- is painful.       Review of Systems  Constitutional: Negative for chills and fever.  HENT: Negative for hearing loss.   Eyes: Negative for blurred vision and double vision.  Respiratory: Negative for cough and shortness of breath.   Cardiovascular: Negative for chest pain, palpitations and leg swelling.  Gastrointestinal: Positive for constipation. Negative for heartburn, nausea and vomiting.  Genitourinary: Negative for dysuria, flank pain and hematuria.  Musculoskeletal: Positive for myalgias and neck pain.  Skin: Negative for rash.  All other systems reviewed and are negative.  Past Medical History:  Diagnosis Date  . Back pain due to injury   . Diabetes [facility])    Past Surgical History:  Procedure Laterality Date  . POSTERIOR CERVICAL FUSION/FORAMINOTOMY N/A 07/17/2019   Procedure: POSTERIOR CERVICAL-THORACIC FUSION, CERVICAL SIX-THORACIC FOUR;  Surgeon: Kary Kos, MD;  Location: [place];  Service: Neurosurgery;  Laterality: N/A;   Family History  Problem Relation Age of Onset  . Cancer Mother   . Diabetes Father    Social History:  reports that he has been smoking cigarettes. He has never used smokeless tobacco. He reports current alcohol use. He reports that he does not use drugs. Allergies: No Known Allergies No medications prior to admission.    Drug Regimen Review Drug regimen was reviewed and remains appropriate with no significant issues identified  Home: Home Living Family/patient expects to be discharged to:: Private residence Living Arrangements: Spouse/significant other, Other relatives Available Help at  Discharge: Family Type of Home: House Home Access: Stairs to enter Secretary/administrator of Steps: 2 Home Layout: One level Bathroom Shower/Tub: Engineer, manufacturing systems: Standard Home  Equipment: None   Functional History: Prior Function Level of Independence: Independent 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Catering manager Status:  Mobility: Bed Mobility Overal bed mobility: Needs Assistance Bed Mobility: Rolling, Sidelying to Sit, Sit to Sidelying Rolling: Mod assist(with use of the rail) Sidelying to sit: Mod assist, +2 for physical assistance Sit to sidelying: Mod assist, +2 for physical assistance General bed mobility comments: pt educated on log roll  and need to transition from side to/from si. Transfers Overall transfer level: Needs assistance Equipment used: Rolling walker (2 wheeled) Transfers: Sit to/from Stand Sit to Stand: Min assist, +2 safety/equipment General transfer comment: cues for hand placement, light min assist for boost.  +2 for safety only Ambulation/Gait Ambulation/Gait assistance: Min assist, +2 safety/equipment Gait Distance (Feet): 22 Feet Assistive device: Rolling walker (2 wheeled) Gait Pattern/deviations: Step-through pattern General Gait Details: slow and guarded Gait velocity interpretation: <1.31 ft/sec, indicative of household ambulator    ADL: ADL Overall ADL's : Needs assistance/impaired Eating/Feeding: Sitting, Bed level, Supervision/ safety, Set up Grooming: Supervision/safety, Set up, Sitting, Bed level Upper Body Bathing: Moderate assistance, Sitting, Bed level Lower Body Bathing: Maximal assistance, Sit to/from stand, Bed level Upper Body Dressing : Maximal assistance, Sitting, Bed level Lower Body Dressing: Maximal assistance, Bed level, Sitting/lateral leans Toilet Transfer: +2 for physical assistance, +2 for safety/equipment, Minimal assistance, RW(simulated at EOB) Toilet Transfer Details (indicate cue type and reason): Min A +2 for sit<>Stand and then Min A for safety with side steps towards HOB Functional mobility during ADLs: Min guard, Minimal assistance, +2 for safety/equipment, Rolling walker General ADL  Comments: pt declined need to perform ADL tasks today, agreeable to mobility within room  Cognition: Cognition Overall Cognitive Status: Within Functional Limits for tasks assessed Orientation Level: Oriented X4 Cognition Arousal/Alertness: Awake/alert Behavior During Therapy: WFL for tasks assessed/performed Overall Cognitive Status: Within Functional Limits for tasks assessed  Physical Exam: Blood pressure (!) 155/91, pulse (!) 107, temperature 98.5 F (36.9 C), temperature source Oral, resp. rate (!) 25, height 5\' 9"  (1.753 m), weight 99.8 kg, SpO2 92 %. Physical Exam  Nursing note and vitals reviewed. Constitutional: He is oriented to person, place, and time.  Awake, alert, appropriate, wearing Cervical collar attached to Thoracic extension, laying supine in bed, spanish speaking, NAD With no interpretor, so hard to get pt to understand how to follow commands for exam  HENT:  Head: Normocephalic and atraumatic.  Nose: Nose normal.  Mouth/Throat: No oropharyngeal exudate.  No facial droop, tongue midline  Eyes: Conjunctivae are normal. Right eye exhibits no discharge. Left eye exhibits no discharge. No scleral icterus.  EOMI B/L no nystagmus  Neck: No tracheal deviation present.  Wearing cervical collar attached to thoracic extension  Cardiovascular:  Tachycardic, regular rhythm; rate 102-110  Respiratory: No stridor.  CTA B/L- good air movement however O2 sats 90% on RA- denies SOB  GI:  Soft, but somewhat distended; NT, hypoactive BS  Musculoskeletal:     Cervical back: Neck supple.     Comments: UEs Biceps 4/5, triceps 4/5, WE 4/5, grip 4-5, finger 5-/5  LEs- 5-/5 in HFs, KE, DF, PF and EHLs B/L  Neurological: He is oriented to person, place, and time. No cranial nerve deficit.  Patient is a bit lethargic but arousable.  Non-English-speaking he did follow some simple demonstrated commands.  Cervical collar  with thoracic extension in place No hoffman's or clonus B/L- no  increased tone in UEs or LEs B/L Sounds like pt reported intact to light touch in all 4 extremities in all dermatomes and torso B/L  Skin: Skin is warm and dry.  Psychiatric:  Appropriate- spanish speaking    No results found for this or any previous visit (from the past 48 hour(s)). No results found.     Medical Problem List and Plan: 1.  Decreased functional mobility secondary to SCI/hangman's fracture T1 fracture dislocation T3 compression deformity/status post C6-T4 pedicle screw fixation 07/17/2019.  CTO collar  -patient may  Not shower- has to keep Cervical collar and thoracic extension in place at all times  -ELOS/Goals: 2-3 weeks; Supervision 2.  Antithrombotics: -DVT/anticoagulation: Check vascular study.  Discuss with neurosurgery on Lovenox  -antiplatelet therapy: N/A 3. Pain Management: Oxycodone and Flexeril as needed 4. Mood: Provide emotional support  -antipsychotic agents: N/A 5. Neuropsych: This patient is capable of making decisions on his own behalf. 6. Skin/Wound Care: Routine skin checks 7. Fluids/Electrolytes/Nutrition: Routine in and outs with follow-up chemistries 8.  Hyperglycemia.  Noted history of diabetes mellitus on no medications at admission.  Check hemoglobin A1c and discuss with family and put on SSI for now- will see what meds, if any was on at home.  9.  Tobacco abuse.  Counseling 10. Tachycardia- mild  11. Hypoxia- pt doesn't feel SOB but sats 90%- will monitor and if drops, will put on O2-  12. Neurogenic bowel and bladder?- no BM since admission- on Colace and Senokot- needs dulcolax suppository and possibly needs Mg citrate if doesn't go tonight. Will check PVRs    Charlton Amor, PA-C 07/21/2019    I have personally performed a face to face diagnostic evaluation of this patient and formulated the key components of the plan.  Additionally, I have personally reviewed laboratory data, imaging studies, as well as relevant notes and concur  with the physician assistant's documentation above.   The patient's status has not changed from the original H&P.  Any changes in documentation from the acute care chart have been noted above.

## 2019-07-22 ENCOUNTER — Inpatient Hospital Stay (HOSPITAL_COMMUNITY): Payer: Self-pay | Admitting: Physical Therapy

## 2019-07-22 ENCOUNTER — Inpatient Hospital Stay (HOSPITAL_COMMUNITY): Payer: Self-pay | Admitting: Occupational Therapy

## 2019-07-22 ENCOUNTER — Inpatient Hospital Stay (HOSPITAL_COMMUNITY): Payer: Self-pay

## 2019-07-22 DIAGNOSIS — S12130G Unspecified traumatic displaced spondylolisthesis of second cervical vertebra, subsequent encounter for fracture with delayed healing: Secondary | ICD-10-CM

## 2019-07-22 LAB — COMPREHENSIVE METABOLIC PANEL
ALT: 19 U/L (ref 0–44)
AST: 18 U/L (ref 15–41)
Albumin: 2.9 g/dL — ABNORMAL LOW (ref 3.5–5.0)
Alkaline Phosphatase: 54 U/L (ref 38–126)
Anion gap: 11 (ref 5–15)
BUN: 6 mg/dL (ref 6–20)
CO2: 28 mmol/L (ref 22–32)
Calcium: 8.4 mg/dL — ABNORMAL LOW (ref 8.9–10.3)
Chloride: 89 mmol/L — ABNORMAL LOW (ref 98–111)
Creatinine, Ser: 0.63 mg/dL (ref 0.61–1.24)
GFR calc Af Amer: >60 mL/min (ref >60)
GFR calc non Af Amer: >60 mL/min (ref >60)
Glucose, Bld: 262 mg/dL — ABNORMAL HIGH (ref 70–99)
Potassium: 3.7 mmol/L (ref 3.5–5.1)
Sodium: 128 mmol/L — ABNORMAL LOW (ref 135–145)
Total Bilirubin: 1 mg/dL (ref 0.3–1.2)
Total Protein: 6.7 g/dL (ref 6.5–8.1)

## 2019-07-22 LAB — CBC WITH DIFFERENTIAL/PLATELET
Abs Immature Granulocytes: 0.34 10*3/uL — ABNORMAL HIGH (ref 0.00–0.07)
Basophils Absolute: 0.1 10*3/uL (ref 0.0–0.1)
Basophils Relative: 1 %
Eosinophils Absolute: 0.4 10*3/uL (ref 0.0–0.5)
Eosinophils Relative: 3 %
HCT: 31.9 % — ABNORMAL LOW (ref 39.0–52.0)
Hemoglobin: 11.3 g/dL — ABNORMAL LOW (ref 13.0–17.0)
Immature Granulocytes: 3 %
Lymphocytes Relative: 17 %
Lymphs Abs: 2 10*3/uL (ref 0.7–4.0)
MCH: 33.9 pg (ref 26.0–34.0)
MCHC: 35.4 g/dL (ref 30.0–36.0)
MCV: 95.8 fL (ref 80.0–100.0)
Monocytes Absolute: 1.7 10*3/uL — ABNORMAL HIGH (ref 0.1–1.0)
Monocytes Relative: 14 %
Neutro Abs: 7.6 10*3/uL (ref 1.7–7.7)
Neutrophils Relative %: 62 %
Platelets: 387 10*3/uL (ref 150–400)
RBC: 3.33 MIL/uL — ABNORMAL LOW (ref 4.22–5.81)
RDW: 11.7 % (ref 11.5–15.5)
WBC: 12.2 10*3/uL — ABNORMAL HIGH (ref 4.0–10.5)
nRBC: 0 % (ref 0.0–0.2)

## 2019-07-22 LAB — GLUCOSE, CAPILLARY
Glucose-Capillary: 251 mg/dL — ABNORMAL HIGH (ref 70–99)
Glucose-Capillary: 277 mg/dL — ABNORMAL HIGH (ref 70–99)
Glucose-Capillary: 293 mg/dL — ABNORMAL HIGH (ref 70–99)
Glucose-Capillary: 256 mg/dL — ABNORMAL HIGH (ref 70–99)

## 2019-07-22 MED ORDER — ENOXAPARIN SODIUM 40 MG/0.4ML ~~LOC~~ SOLN
40.0000 mg | SUBCUTANEOUS | Status: DC
  Administered 2019-07-25 – 2019-07-30 (×6): 40 mg via SUBCUTANEOUS
  Filled 2019-07-22 (×6): qty 0.4

## 2019-07-22 MED ORDER — METFORMIN HCL 500 MG PO TABS
500.0000 mg | ORAL_TABLET | Freq: Two times a day (BID) | ORAL | Status: DC
  Administered 2019-07-22 – 2019-07-24 (×4): 500 mg via ORAL
  Filled 2019-07-22 (×4): qty 1

## 2019-07-22 MED ORDER — OXYCODONE HCL 5 MG PO TABS
5.0000 mg | ORAL_TABLET | ORAL | Status: DC | PRN
  Administered 2019-07-22 – 2019-07-25 (×16): 10 mg via ORAL
  Filled 2019-07-22 (×16): qty 2

## 2019-07-22 NOTE — H&P (Signed)
Physical Medicine and Rehabilitation Admission H&P        Chief Complaint  Patient presents with  . Head Injury  : HPI: Charles Perez is a 34 year old non-English-speaking male on no prescription medications with history of tobacco abuse as well as diabetes mellitus on no present medications.  Per chart review lives with his girlfriend and her 27 year old son.  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  Presented 07/16/2019 after a 16 foot wall collapsed on him while at work.  He did have transient loss of consciousness.  Complaints of back neck and upper back pain.  Admission chemistries unremarkable except WBC 19,800, glucose 299, sodium 132.  Cranial CT scan negative.  CT cervical spine showed mildly displaced fracture seen involving the bilateral pedicles of C2.  Moderately displaced fracture also seen involving the left lateral mass of C2 with disruption of the left transverse foramina.  Fracture also seen extending through the right transverse foramen.  There was some concern for vertebral artery injury.  Moderately displaced fracture seen involving the posterior spinous process of C7 with mildly displaced fracture involving distal tip of spinous process of T11.  Moderately compressed T1 vertebral body fracture noted with possible posterior displacement of fracture fragment into spinal canal.  CTA head and neck without vascular injury or occlusion.  Hangman fracture and patient underwent posterior cervical thoracic fixation C6-T4 utilizing the globus Creo set with the cortex posterior cervical set and posterior lateral arthrodesis from C6-T4 as well as placement of lateral mass screws C6 pedicle screws at C7, T1-T2-T3 and T4 bilaterally 07/17/2019 per Dr. Saintclair Halsted.  CTO collar at all times.  Tolerating a regular diet.  Therapy evaluations completed and patient was admitted for a comprehensive rehab program.   Hasn't had any BM lately.  Does void- but  hasn't had any PVRs.  Says pain is 8-9/10 currently from moving around prior to me seeing him with therapy - usually meds work for him.    Also c/o L antecubital IV- is painful.            Review of Systems  Constitutional: Negative for chills and fever.  HENT: Negative for hearing loss.   Eyes: Negative for blurred vision and double vision.  Respiratory: Negative for cough and shortness of breath.   Cardiovascular: Negative for chest pain, palpitations and leg swelling.  Gastrointestinal: Positive for constipation. Negative for heartburn, nausea and vomiting.  Genitourinary: Negative for dysuria, flank pain and hematuria.  Musculoskeletal: Positive for myalgias and neck pain.  Skin: Negative for rash.  All other systems reviewed and are negative.       Past Medical History:  Diagnosis Date  . Back pain due to injury    . Diabetes [facility])           Past Surgical History:  Procedure Laterality Date  . POSTERIOR CERVICAL FUSION/FORAMINOTOMY N/A 07/17/2019    Procedure: POSTERIOR CERVICAL-THORACIC FUSION, CERVICAL SIX-THORACIC FOUR;  Surgeon: Kary Kos, MD;  Location: [place];  Service: Neurosurgery;  Laterality: N/A;         Family History  Problem Relation Age of Onset  . Cancer Mother    . Diabetes Father      Social History:  reports that he has been smoking cigarettes. He has never used smokeless tobacco. He reports current alcohol use. He reports that he does not use drugs. Allergies: No Known Allergies No medications prior to admission.  Drug Regimen Review Drug regimen was reviewed and remains appropriate with no significant issues identified   Home: Home Living Family/patient expects to be discharged to:: Private residence Living Arrangements: Spouse/significant other, Other relatives Available Help at Discharge: Family Type of Home: House Home Access: Stairs to enter Secretary/administrator of Steps: 2 Home Layout: One level Bathroom Shower/Tub:  Engineer, manufacturing systems: Standard Home Equipment: None   Functional History: Prior Function Level of Independence: Independent 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Surveyor, minerals Status:  Mobility: Bed Mobility Overal bed mobility: Needs Assistance Bed Mobility: Rolling, Sidelying to Sit, Sit to Sidelying Rolling: Mod assist(with use of the rail) Sidelying to sit: Mod assist, +2 for physical assistance Sit to sidelying: Mod assist, +2 for physical assistance General bed mobility comments: pt educated on log roll  and need to transition from side to/from si. Transfers Overall transfer level: Needs assistance Equipment used: Rolling walker (2 wheeled) Transfers: Sit to/from Stand Sit to Stand: Min assist, +2 safety/equipment General transfer comment: cues for hand placement, light min assist for boost.  +2 for safety only Ambulation/Gait Ambulation/Gait assistance: Min assist, +2 safety/equipment Gait Distance (Feet): 22 Feet Assistive device: Rolling walker (2 wheeled) Gait Pattern/deviations: Step-through pattern General Gait Details: slow and guarded Gait velocity interpretation: <1.31 ft/sec, indicative of household ambulator   ADL: ADL Overall ADL's : Needs assistance/impaired Eating/Feeding: Sitting, Bed level, Supervision/ safety, Set up Grooming: Supervision/safety, Set up, Sitting, Bed level Upper Body Bathing: Moderate assistance, Sitting, Bed level Lower Body Bathing: Maximal assistance, Sit to/from stand, Bed level Upper Body Dressing : Maximal assistance, Sitting, Bed level Lower Body Dressing: Maximal assistance, Bed level, Sitting/lateral leans Toilet Transfer: +2 for physical assistance, +2 for safety/equipment, Minimal assistance, RW(simulated at EOB) Toilet Transfer Details (indicate cue type and reason): Min A +2 for sit<>Stand and then Min A for safety with side steps towards HOB Functional mobility during ADLs: Min guard, Minimal assistance, +2 for  safety/equipment, Rolling walker General ADL Comments: pt declined need to perform ADL tasks today, agreeable to mobility within room   Cognition: Cognition Overall Cognitive Status: Within Functional Limits for tasks assessed Orientation Level: Oriented X4 Cognition Arousal/Alertness: Awake/alert Behavior During Therapy: WFL for tasks assessed/performed Overall Cognitive Status: Within Functional Limits for tasks assessed   Physical Exam: Blood pressure (!) 155/91, pulse (!) 107, temperature 98.5 F (36.9 C), temperature source Oral, resp. rate (!) 25, height 5\' 9"  (1.753 m), weight 99.8 kg, SpO2 92 %. Physical Exam  Nursing note and vitals reviewed. Constitutional: He is oriented to person, place, and time.  Awake, alert, appropriate, wearing Cervical collar attached to Thoracic extension, laying supine in bed, spanish speaking, NAD With no interpretor, so hard to get pt to understand how to follow commands for exam  HENT:  Head: Normocephalic and atraumatic.  Nose: Nose normal.  Mouth/Throat: No oropharyngeal exudate.  No facial droop, tongue midline  Eyes: Conjunctivae are normal. Right eye exhibits no discharge. Left eye exhibits no discharge. No scleral icterus.  EOMI B/L no nystagmus  Neck: No tracheal deviation present.  Wearing cervical collar attached to thoracic extension  Cardiovascular:  Tachycardic, regular rhythm; rate 102-110  Respiratory: No stridor.  CTA B/L- good air movement however O2 sats 90% on RA- denies SOB  GI:  Soft, but somewhat distended; NT, hypoactive BS  Musculoskeletal:     Cervical back: Neck supple.     Comments: UEs Biceps 4/5, triceps 4/5, WE 4/5, grip 4-5, finger 5-/5  LEs- 5-/5 in HFs, KE,  DF, PF and EHLs B/L  Neurological: He is oriented to person, place, and time. No cranial nerve deficit.  Patient is a bit lethargic but arousable.  Non-English-speaking he did follow some simple demonstrated commands.  Cervical collar with thoracic  extension in place No hoffman's or clonus B/L- no increased tone in UEs or LEs B/L Sounds like pt reported intact to light touch in all 4 extremities in all dermatomes and torso B/L  Skin: Skin is warm and dry.  Psychiatric:  Appropriate- spanish speaking      Lab Results Last 48 Hours  No results found for this or any previous visit (from the past 48 hour(s)).   Imaging Results (Last 48 hours)  No results found.           Medical Problem List and Plan: 1.  Decreased functional mobility secondary to SCI/hangman's fracture T1 fracture dislocation T3 compression deformity/status post C6-T4 pedicle screw fixation 07/17/2019.  CTO collar             -patient may  Not shower- has to keep Cervical collar and thoracic extension in place at all times             -ELOS/Goals: 2-3 weeks; Supervision 2.  Antithrombotics: -DVT/anticoagulation: Check vascular study.  Discuss with neurosurgery on Lovenox             -antiplatelet therapy: N/A 3. Pain Management: Oxycodone and Flexeril as needed 4. Mood: Provide emotional support             -antipsychotic agents: N/A 5. Neuropsych: This patient is capable of making decisions on his own behalf. 6. Skin/Wound Care: Routine skin checks 7. Fluids/Electrolytes/Nutrition: Routine in and outs with follow-up chemistries 8.  Hyperglycemia.  Noted history of diabetes mellitus on no medications at admission.  Check hemoglobin A1c and discuss with family and put on SSI for now- will see what meds, if any was on at home.  9.  Tobacco abuse.  Counseling 10. Tachycardia- mild  11. Hypoxia- pt doesn't feel SOB but sats 90%- will monitor and if drops, will put on O2-  12. Neurogenic bowel and bladder?- no BM since admission- on Colace and Senokot- needs dulcolax suppository and possibly needs Mg citrate if doesn't go tonight. Will check PVRs       Charlton Amor, PA-C 07/21/2019      I have personally performed a face to face diagnostic evaluation of  this patient and formulated the key components of the plan.  Additionally, I have personally reviewed laboratory data, imaging studies, as well as relevant notes and concur with the physician assistant's documentation above.   The patient's status has not changed from the original H&P.  Any changes in documentation from the acute care chart have been noted above.

## 2019-07-22 NOTE — Evaluation (Signed)
Physical Therapy Assessment and Plan  Patient Details  Name: Charles Perez MRN: [id number] Date of Birth: [birth year]  PT Diagnosis: Abnormal posture, Abnormality of gait, Impaired cognition, Muscle weakness and Pain in neck Rehab Potential: Good ELOS: 10-14 days   Today's Date: 07/22/2019 PT Individual Time: 1415-1500 PT Individual Time Calculation (min): 45 min    Problem List:  Patient Active Problem List   Diagnosis Date Noted  . Hangman's fracture ([place]) 07/21/2019  . Thoracic spine fracture ([place]) 07/17/2019  . Thoracic compression fracture ([place]) 07/16/2019    Past Medical History:  Past Medical History:  Diagnosis Date  . Back pain due to injury   . Diabetes [facility])    Past Surgical History:  Past Surgical History:  Procedure Laterality Date  . POSTERIOR CERVICAL FUSION/FORAMINOTOMY N/A 07/17/2019   Procedure: POSTERIOR CERVICAL-THORACIC FUSION, CERVICAL SIX-THORACIC FOUR;  Surgeon: Kary Kos, MD;  Location: [place];  Service: Neurosurgery;  Laterality: N/A;    Assessment & Plan Clinical Impression: Patient is a 34 year old non-English-speaking male on no prescription medications with history of tobacco abuse as well as diabetes mellitus on no present medications. Per chart review lives with his girlfriendand her 70 year old son. 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 Presented 07/16/2019 after a 16 foot wall collapsed on him while at work. He did have transient loss of consciousness. Complaints of back neck and upper back pain. Admission chemistries unremarkable except WBC 19,800, glucose 299, sodium 132. Cranial CT scan negative. CT cervical spine showed mildly displaced fracture seen involving the bilateral pedicles of C2. Moderately displaced fracture also seen involving the left lateral mass of C2 with disruption of the left transverse foramina. Fracture also seen extending through the right transverse foramen. There was  some concern for vertebral artery injury. Moderately displaced fracture seen involving the posterior spinous process of C7 with mildly displaced fracture involving distal tip of spinous process of T11. Moderately compressed T1 vertebral body fracture noted with possible posterior displacement of fracture fragment into spinal canal. CTA head and neck without vascular injury or occlusion.Hangman fracture and patient underwent posterior cervical thoracic fixation C6-T4 utilizing the globus Creo set with the cortex posterior cervical set and posterior lateral arthrodesis from C6-T4 as well as placement of lateral mass screws C6 pedicle screws at C7, T1-T2-T3 and T4 bilaterally 07/17/2019 per Dr. Saintclair Halsted. CTOcollar at all times.  Patient transferred to CIR on 07/21/2019 .   Patient currently requires min with mobility secondary to muscle weakness and muscle joint tightness, decreased cardiorespiratoy endurance, decreased attention, decreased awareness, decreased problem solving, decreased safety awareness, decreased memory and delayed processing and decreased sitting balance, decreased standing balance, hemiplegia, decreased balance strategies and difficulty maintaining precautions.  Prior to hospitalization, patient was independent  with mobility and lived with Significant other, Son in a House home.  Home access is 2Stairs to enter.  Patient will benefit from skilled PT intervention to maximize safe functional mobility, minimize fall risk and decrease caregiver burden for planned discharge home with 24 hour supervision.  Anticipate patient will benefit from follow up [place] at discharge.  PT - End of Session Activity Tolerance: Tolerates < 10 min activity, no significant change in vital signs Endurance Deficit: Yes PT Assessment Rehab Potential (ACUTE/IP ONLY): Good PT Barriers to Discharge: Inaccessible home environment;Medical stability;Insurance for SNF coverage;Home environment access/layout;New  diabetic;Wound Care;Medication compliance;Behavior PT Patient demonstrates impairments in the following area(s): Balance;Behavior;Edema;Endurance;Motor;Pain;Skin Integrity PT Transfers Functional Problem(s): Bed Mobility;Bed to Chair;Car;Furniture;Floor PT Locomotion Functional Problem(s):  Ambulation;Wheelchair Mobility;Stairs PT Plan PT Intensity: Minimum of 1-2 x/day ,45 to 90 minutes PT Frequency: 5 out of 7 days PT Duration Estimated Length of Stay: 10-14 days PT Treatment/Interventions: Ambulation/gait training;Balance/vestibular training;Cognitive remediation/compensation;Community reintegration;Patient/family education;Functional electrical stimulation;Disease management/prevention;Discharge planning;DME/adaptive equipment instruction;Functional mobility training;Neuromuscular re-education;Pain management;Psychosocial support;Skin care/wound management;Splinting/orthotics;Stair training;Therapeutic Activities;Therapeutic Exercise;UE/LE Strength taining/ROM;UE/LE Coordination activities;Visual/perceptual remediation/compensation;Wheelchair propulsion/positioning PT Transfers Anticipated Outcome(s): Supervision assist with LRAD PT Locomotion Anticipated Outcome(s): Ambulatory with LRAD and supervision assist PT Recommendation Follow Up Recommendations: Home health PT Patient destination: Home Equipment Recommended: Rolling walker with 5" wheels;Wheelchair cushion (measurements);Wheelchair (measurements)  Skilled Therapeutic Intervention Pt received supine in bed and agreeable to PT. Rolling R/L with supervision assist and bed rail to place brace. Supine>sit transfer with min assist and increased time. Stand pivot transfer to [facility] with min assist and increased time. PT instructed patient in PT Evaluation and initiated treatment intervention; see below for results. PT educated patient in [place], rehab potential, rehab goals, and discharge recommendations. Gait training in room with RW x 9f with min  assist. PT encouraging pt to attempt WC propulsion, car transfer, or stair management, but pt declining due to severe upper thoracic/cervical pain. PA then present to speak with patient and family member about pain control and new cognitive deficits. Pt then requesting to return to bed. Sit>supine with min assist for LE control. Rolling r and L to doff brace with supervision assist and use of bed rail. Once in bed, pt's IV noted to have dislodged. PT applied pressure and notified RN. Pt left supine in bed with call bell in reach.       PT Evaluation Precautions/Restrictions   cervical. Brace when OOB. Don in supine  General   Vital SignsTherapy Vitals Temp: 98.9 F (37.2 C) Temp Source: Oral Pulse Rate: (!) 106 Resp: 20 BP: 110/68 Patient Position (if appropriate): Lying Oxygen Therapy SpO2: 95 % O2 Device: Room Air Pain Pain Assessment Pain Scale: 0-10 Pain Score: 7  Pain Type: Acute pain Pain Location: Neck Pain Orientation: Mid Pain Descriptors / Indicators: Aching Pain Frequency: Intermittent Pain Onset: On-going Patients Stated Pain Goal: 3 Pain Intervention(s): Medication (See eMAR);Repositioned Home Living/Prior Functioning Home Living Living Arrangements: Spouse/significant other;Children Available Help at Discharge: Family;Available 24 hours/day Type of Home: House Home Access: Stairs to enter [employer] Steps: 2 Home Layout: One level Bathroom Shower/Tub: TChiropodist Standard Bathroom Accessibility: Yes  Lives With: Significant other;Son Prior Function Level of Independence: Independent with basic ADLs  Able to Take Stairs?: Yes 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[employer]ision/Perception     [employer] Orientation Level: Oriented to person;Oriented to place;Disoriented to time;Oriented to situation Memory: Impaired Safety/Judgment: Impaired Sensation Sensation Light Touch: Appears Intact Additional Comments: slow  movements due to pain Coordination Gross Motor Movements are Fluid and Coordinated: Yes Fine Motor Movements are Fluid and Coordinated: Yes Motor     Mobility Bed Mobility Bed Mobility: Rolling Right;Sit to Supine;Rolling Left;Supine to Sit Rolling Right: Supervision/verbal cueing Rolling Left: Supervision/Verbal cueing Supine to Sit: Minimal Assistance - Patient > 75% Sit to Supine: Minimal Assistance - Patient > 75% Transfers Transfers: Sit to Stand;Stand Pivot Transfers Sit to Stand: Minimal Assistance - Patient > 75% Stand Pivot Transfers: Minimal Assistance - Patient > 75% Transfer (Assistive device): 1 person hand held assist Locomotion  Gait Ambulation: Yes Gait Assistance: Minimal Assistance - Patient > 75% Gait Distance (Feet): 15 Feet Assistive device: Rolling walker Gait Gait: Yes Gait Pattern: Impaired Gait Pattern: Wide base of support;Poor foot clearance -  left;Poor foot clearance - right Stairs / Additional Locomotion Stairs: No(refused) Wheelchair Mobility Wheelchair Mobility: No(refused due to pain)  Trunk/Postural Assessment  Cervical Assessment Cervical Assessment: Exceptions to WFL(aspen Cervical/thoracic collar/brace) Thoracic Assessment Thoracic Assessment: Exceptions to WFL(cervical/thoracic brace with C7-T4 fusion) Lumbar Assessment Lumbar Assessment: Exceptions to [facility] Postural Control Postural Control: Within Functional Limits  Balance Balance Balance Assessed: Yes Static Sitting Balance Static Sitting - Balance Support: No upper extremity supported Static Sitting - Level of Assistance: 5: Stand by assistance Dynamic Sitting Balance Dynamic Sitting - Balance Support: Right upper extremity supported Dynamic Sitting - Level of Assistance: 5: Stand by assistance;4: Min assist Static Standing Balance Static Standing - Balance Support: No upper extremity supported Static Standing - Level of Assistance: 4: Min assist Dynamic Standing  Balance Dynamic Standing - Balance Support: Bilateral upper extremity supported Dynamic Standing - Level of Assistance: 4: Min assist Extremity Assessment      RLE Assessment RLE Assessment: Within Functional Limits General Strength Comments: grossly 4+/5 proximal to distal LLE Assessment LLE Assessment: Exceptions to [facility] General Strength Comments: grossly 4+/5 proximal to distal    Refer to Care Plan for Long Term Goals  Recommendations for other services: None   Discharge Criteria: Patient will be discharged from PT if patient refuses treatment 3 consecutive times without medical reason, if treatment goals not met, if there is a change in medical status, if patient makes no progress towards goals or if patient is discharged from hospital.  The above assessment, treatment plan, treatment alternatives and goals were discussed and mutually agreed upon: by patient  Lorie Phenix 07/22/2019, 3:03 PM

## 2019-07-22 NOTE — [place] Notes (Signed)
Inpatient Rehabilitation  Patient information reviewed and entered into eRehab system by Cliff Damiani M. Alvy Alsop, M.A., CCC/SLP, PPS Coordinator.  Information including medical coding, functional ability and quality indicators will be reviewed and updated through discharge.    

## 2019-07-22 NOTE — [place] Notes (Signed)
Family concerned with pts mental status stating that pt appears more confused today. Pt A&O x 3 at this time with neuro exam WNL. Jesusita Oka, PA called and asked to assess at bedside. Will continue to monitor.

## 2019-07-22 NOTE — [place] Notes (Addendum)
Charles Perez PHYSICAL MEDICINE & REHABILITATION PROGRESS NOTE   Subjective/Complaints:  Pt reports having a lot of pain in his neck- first asked for an injection, but when explained we don't do on rehab via interpretor, he said he didn't say that.   LBM yesterday- "working OK".  Has been OK'd to start lovenox.  Needs new pads for collar due to OT.   WBC down to 12.2 from 16k; NA also down to 128 from 137.   ROS:   Pt denies SOB, abd pain, CP, N/V/C/D, and vision changes  Objective:   No results found. Recent Labs    07/22/19 0537  WBC 12.2*  HGB 11.3*  HCT 31.9*  PLT 387   Recent Labs    07/22/19 0537  NA 128*  K 3.7  CL 89*  CO2 28  GLUCOSE 262*  BUN 6  CREATININE 0.63  CALCIUM 8.4*   No intake or output data in the 24 hours ending 07/22/19 1623   Physical Exam: Vital Signs Blood pressure 110/68, pulse (!) 106, temperature 98.9 F (37.2 C), temperature source Oral, resp. rate 20, height 5\' 9"  (1.753 m), weight 96.9 kg, SpO2 95 %.   Constitutional: pt appropriate, awake, alert, talked via interpretor, NAD. HENT:  Wearing hard cervical collar with thoracic extension- pads dirty Cardiovascular:tachycardic regular rhythm, rate 110s Respiratory: CTA B/L- sats 95% on RA , soft, NT, ND, (+)BS Musculoskeletal:  Comments: Biceps 4/5, triceps 4/5, WE 4/5, grip 4-5, finger 5-/5 LEs- 5-/5 in HFs, KE, DF, PF and EHLs B/L Neurological: Ox3;  No increased tone, hoffman's, increased tone B/L Sounds like pt reported intact to light touch in all 4 extremities in all dermatomes and torso B/L Skin: Skin iswarmand dry.  Psychiatric: standoffish- didn't want to interact  Assessment/Plan: 1. Functional deficits secondary to Hangman's fracture and resulting SCI  which require 3+ hours per day of interdisciplinary therapy in a comprehensive inpatient rehab setting.  Physiatrist is providing close team supervision and 24 hour management of  active medical problems listed below.  Physiatrist and rehab team continue to assess barriers to discharge/monitor patient progress toward functional and medical goals  Care Tool:  Bathing              Bathing assist       Upper Body Dressing/Undressing Upper body dressing        Upper body assist      Lower Body Dressing/Undressing Lower body dressing            Lower body assist       Toileting Toileting    Toileting assist Assist for toileting: Minimal Assistance - Patient > 75%     Transfers Chair/bed transfer  Transfers assist           Locomotion Ambulation   Ambulation assist              Walk 10 feet activity   Assist           Walk 50 feet activity   Assist           Walk 150 feet activity   Assist           Walk 10 feet on uneven surface  activity   Assist           Wheelchair     Assist               Wheelchair 50 feet with 2 turns activity    Assist  Wheelchair 150 feet activity     Assist          Blood pressure 110/68, pulse (!) 106, temperature 98.9 F (37.2 C), temperature source Oral, resp. rate 20, height 5\' 9"  (1.753 m), weight 96.9 kg, SpO2 95 %.  Medical Problem List and Plan: 1.Decreased functional mobilitysecondary to SCI/hangman's fractureT1 fracture dislocation T3 compression deformity/status post C6-T4 pedicle screw fixation 07/17/2019.CTOcollar -patient mayNot shower- has to keep Cervical collar and thoracic extension in place at all times  3/26- ordered new pads for him -ELOS/Goals: 2-3 weeks; Supervision 2. Antithrombotics: -DVT/anticoagulation:Check vascular study. Discuss with neurosurgery on Lovenox 3/26- got OK to put on lovenox- started today -antiplatelet therapy: N/A 3. Pain Management:Oxycodone and Flexeril as needed  3/26- changed oxy to 5-10 mg q3 hours prn since pt was  supposedly confused today 4. Mood:Provide emotional support -antipsychotic agents: N/A 5. Neuropsych: This patientiscapable of making decisions on hisown behalf. 6. Skin/Wound Care:Routine skin checks 7. Fluids/Electrolytes/Nutrition:Routine in and outs with follow-up chemistries 8. Hyperglycemia. Noted history of diabetes mellitus on no medications at admission. Check hemoglobin A1c and discuss with family and put on SSI for now- will see what meds, if any was on at home.  3/26- put on metformin- don't think was on meds at home, that could tell.  9. Tobacco abuse. Counseling 10. Tachycardia- mild  11. Hypoxia- pt doesn't feel SOB but sats 90%- will monitor and if drops, will put on O2-   3/26- sats 95%- will monitor 12. Neurogenic bowel and bladder?- no BM since admission- on Colace and Senokot- needs dulcolax suppository and possibly needs Mg citrate if doesn't go tonight. Will check PVRs  3/26- LBM yesterday after admission.  3/26- added PVR order x3- if needs cathing, might need flomax   13/ Hyponatremia  3/26- NA 128- will recheck Monday and monitor closely  LOS: 1 days A FACE TO FACE EVALUATION WAS PERFORMED  Charles Perez 07/22/2019, 4:23 PM

## 2019-07-22 NOTE — [place] Notes (Signed)
Orthopedic Tech [place] Note Patient Details:  Charles Perez 1985/11/09 [id number]  Patient ID: Charles Perez, male   DOB: [birth year], 34 y.o.   MRN: [id number]   Charles Perez 07/22/2019, 9:04 AMRoute brace order to [facility].

## 2019-07-22 NOTE — [place] Notes (Signed)
Patient has been cleared to begin Lovenox for DVT prophylaxis 07/25/2019.  Plan to enter orders for Lovenox 40 mg daily beginning 07/25/2019

## 2019-07-22 NOTE — Evaluation (Signed)
Occupational Therapy Assessment and Plan  Patient Details  Name: Charles Perez MRN: [id number] Date of Birth: 1985/07/11  OT Diagnosis: abnormal posture, acute pain, cognitive deficits, muscle weakness (generalized) and pain in thoracic spine Rehab Potential: Rehab Potential (ACUTE ONLY): Excellent ELOS: 8-10 days   Today's Date: 07/22/2019 OT Individual Time: 6237-6283 OT Individual Time Calculation (min): 64 min     Problem List:  Patient Active Problem List   Diagnosis Date Noted  . Hangman's fracture ([place]) 07/21/2019  . Thoracic spine fracture ([place]) 07/17/2019  . Thoracic compression fracture ([place]) 07/16/2019    Past Medical History:  Past Medical History:  Diagnosis Date  . Back pain due to injury   . Diabetes [facility])    Past Surgical History:  Past Surgical History:  Procedure Laterality Date  . POSTERIOR CERVICAL FUSION/FORAMINOTOMY N/A 07/17/2019   Procedure: POSTERIOR CERVICAL-THORACIC FUSION, CERVICAL SIX-THORACIC FOUR;  Surgeon: Kary Kos, MD;  Location: [place];  Service: Neurosurgery;  Laterality: N/A;    Assessment & Plan Clinical Impression: Patient is a 34 y.o. year old male with recent admission to the hospital on 07/16/2019 after a 16 foot wall collapsed on him while at work. He did have transient loss of consciousness. Complaints of back neck and upper back pain. Admission chemistries unremarkable except WBC 19,800, glucose 299, sodium 132. Cranial CT scan negative. CT cervical spine showed mildly displaced fracture seen involving the bilateral pedicles of C2. Moderately displaced fracture also seen involving the left lateral mass of C2 with disruption of the left transverse foramina. Fracture also seen extending through the right transverse foramen. There was some concern for vertebral artery injury. Moderately displaced fracture seen involving the posterior spinous process of C7 with mildly displaced fracture involving distal tip of spinous process of T11.  Moderately compressed T1 vertebral body fracture noted with possible posterior displacement of fracture fragment into spinal canal. CTA head and neck without vascular injury or occlusion.Hangman fracture and patient underwent posterior cervical thoracic fixation C6-T4 utilizing the globus Creo set with the cortex posterior cervical set and posterior lateral arthrodesis from C6-T4 as well as placement of lateral mass screws C6 pedicle screws at C7, T1-T2-T3 and T4 bilaterally 07/17/2019 per Dr. Saintclair Halsted. CTOcollar at all times..  Patient transferred to CIR on 07/21/2019 .    Patient currently requires max with basic self-care skills secondary to muscle weakness, decreased awareness, decreased problem solving, decreased safety awareness and decreased memory and decreased standing balance and decreased balance strategies.  Prior to hospitalization, patient could complete ADLs with independent .  Patient will benefit from skilled intervention to decrease level of assist with basic self-care skills and increase level of independence with iADL prior to discharge home with care partner.  Anticipate patient will require intermittent supervision and follow up home health.  OT - End of Session Activity Tolerance: Decreased this session Endurance Deficit: Yes OT Assessment Rehab Potential (ACUTE ONLY): Excellent OT Patient demonstrates impairments in the following area(s): Balance;Cognition;Endurance;Pain;Safety OT Basic ADL's Functional Problem(s): Grooming;Bathing;Dressing;Toileting OT Advanced ADL's Functional Problem(s): Simple Meal Preparation OT Transfers Functional Problem(s): Tub/Shower;Toilet OT Additional Impairment(s): None OT Plan OT Intensity: Minimum of 1-2 x/day, 45 to 90 minutes OT Frequency: 5 out of 7 days OT Duration/Estimated Length of Stay: 8-10 days OT Treatment/Interventions: Balance/vestibular training;Self Care/advanced ADL retraining;Therapeutic Activities;UE/LE Coordination  activities;Discharge planning;Pain management;Cognitive remediation/compensation;Functional mobility training;Patient/family education;Therapeutic Exercise;DME/adaptive equipment instruction;Neuromuscular re-education;UE/LE Strength taining/ROM;Wheelchair propulsion/positioning OT Self Feeding Anticipated Outcome(s): independent OT Basic Self-Care Anticipated Outcome(s): supervision OT Toileting Anticipated Outcome(s): modified independent OT Bathroom  Transfers Anticipated Outcome(s): supervision OT Recommendation Patient destination: Home Follow Up Recommendations: None Equipment Recommended: To be determined   Skilled Therapeutic Intervention Pt worked on bathing and dressing from supine to sit EOB.  He was able to complete all UB bathing in supine with HOB elevated secondary to not being able to get up without the CTO brace on.  He was able to roll side to side with min assist using the bed rails for therapist to assist with washing his back.  Donned brace in supine with total assist with transition to sitting with mod assist.  He was then able to complete washing of his peri area with min assist.  He needed therapist assist to wash his lower legs and feet as well as for donning all LB clothing.  He was only able to tolerate sitting EOB for 15 mins before requesting to lay down secondary to increased pain.  Min assist for sit to supine with total assist for removal of the CTO.  Pt left with call button and phone in reach working on eating with [place] elevated.  Interpreter present throughout session.  Discussed expectations of goals at supervision to modified independent level with ELOS of 1-1.5 weeks.  Pt is in agreement and understanding.    Session 2: (1005-1104)  Pt worked on donning the CTO in supine rolling side to side.  He needed total assist for donning brace with transition to sitting with mod assist.  Once on the EOB, had pt complete toilet transfer with use of the RW for support and min  assist.  He was able to stand and complete urination but with decreased accuracy.  Once complete, he wanted to transfer back to the bed after only being up in sitting or standing during session for no more than 15 mins.  Therapist persuaded him to sit in the chair for a few mins while therapist cleaned up the bathroom.  He needed min assist for functional mobility back to the bed from the bedside recliner.  He transitioned to supine with min assist and then therapist removed the brace with total assist.  Pt left with call button and phone in reach with bed alarm in place.    OT Evaluation Precautions/Restrictions  Precautions Precautions: Fall;Cervical;Back Precaution Comments: brace must be donned in supine Required Braces or Orthoses: Cervical Brace;Spinal Brace Cervical Brace: Hard collar;At all times Spinal Brace: Other (comment) Spinal Brace Comments: Brace can be removed in bed  Pain Pain Assessment Pain Scale: 0-10 Pain Score: 7  Pain Location: Neck Pain Orientation: Mid Pain Descriptors / Indicators: Aching Patients Stated Pain Goal: 3 Pain Intervention(s): Medication (See eMAR);Repositioned Home Living/Prior [place] expects to be discharged to:: Private residence Living Arrangements: Spouse/significant other, Children Available Help at Discharge: Family, Available 24 hours/day Type of Home: House Home Access: Stairs to enter [employer] of Steps: 2 Home Layout: One level Bathroom Shower/Tub: Optometrist: Yes  Lives With: Significant other, Son IADL History Current License: Yes Occupation: Full time employment 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 Level of Independence: Independent with basic ADLs  Able to [place]?: Yes 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Architect ADL ADL Eating: Independent Where [place]: Bed level Grooming:  Supervision/safety Where Assessed-Grooming: Edge of bed Upper Body Bathing: Supervision/safety Where Assessed-Upper Body Bathing: Bed level Lower Body Bathing: Maximal assistance Where Assessed-Lower Body Bathing: Edge of bed Upper Body Dressing: Maximal assistance Where Assessed-Upper Body Dressing: Bed level Lower Body Dressing:  Maximal assistance Where Assessed-Lower Body Dressing: Edge of bed Toileting: Minimal assistance Where Assessed-Toileting: Glass blower/designer: Psychiatric nurse Method: Counselling psychologist: Bedside commode Vision Baseline Vision/History: No visual deficits Patient Visual Report: No change from baseline Vision Assessment?: No apparent visual deficits Additional Comments: Will continue to assess in treatment for any deficits not seen on eval. Perception  Perception: Within Functional Limits Praxis Praxis: Intact Cognition Overall Cognitive Status: Impaired/Different from baseline Arousal/Alertness: Lethargic Orientation Level: Person;Place;Situation Person: Oriented Place: Oriented Situation: Oriented Year: 2021 Month: March Day of Week: Incorrect(Thursday) Memory: Impaired Memory Impairment: Storage deficit;Decreased recall of new information Immediate Memory Recall: Sock;Blue;Bed Memory Recall Sock: With Cue Memory Recall Blue: Without Cue Memory Recall Bed: Not able to recall Attention: Sustained;Selective Sustained Attention: Appears intact Selective Attention: Impaired Selective Attention Impairment: Functional basic Awareness: Impaired Awareness Impairment: Anticipatory impairment Problem Solving: Impaired Problem Solving Impairment: Functional basic Safety/Judgment: Impaired Comments: Pt with slower processing at times as well as decreased memory.  Unsure if this may be attributable to pain meds, so will continue to assess in treatment. Sensation Sensation Light Touch: Appears Intact Hot/Cold:  Appears Intact Proprioception: Appears Intact Stereognosis: Appears Intact Additional Comments: slow movements due to pain Coordination Gross Motor Movements are Fluid and Coordinated: Yes Fine Motor Movements are Fluid and Coordinated: Yes Finger Nose Finger Test: [facility] Motor  Motor Motor: Within Functional Limits Motor - Skilled Clinical Observations: generalized weakness and limitations secondary to increased pain Mobility  Bed Mobility Bed Mobility: Rolling Right;Sit to Supine;Rolling Left;Supine to Sit Rolling Right: Minimal Assistance - Patient > 75% Rolling Left: Minimal Assistance - Patient > 75% Supine to Sit: Moderate Assistance - Patient 50-74% Sit to Supine: Minimal Assistance - Patient > 75% Transfers Sit to Stand: Minimal Assistance - Patient > 75% Stand to Sit: Minimal Assistance - Patient > 75%  Trunk/Postural Assessment  Cervical Assessment Cervical Assessment: Exceptions to WFL(aspen Cervical/thoracic collar/brace) Thoracic Assessment Thoracic Assessment: Exceptions to WFL(cervical/thoracic brace with C7-T4 fusion) Lumbar Assessment Lumbar Assessment: Exceptions to [facility] Postural Control Postural Control: Within Functional Limits  Balance Balance Balance Assessed: Yes Static Sitting Balance Static Sitting - Balance Support: No upper extremity supported Static Sitting - Level of Assistance: 5: Stand by assistance Dynamic Sitting Balance Dynamic Sitting - Balance Support: Feet supported Dynamic Sitting - Level of Assistance: 4: Min assist Static Standing Balance Static Standing - Balance Support: During functional activity Static Standing - Level of Assistance: 4: Min assist Dynamic Standing Balance Dynamic Standing - Balance Support: Bilateral upper extremity supported Dynamic Standing - Level of Assistance: 4: Min assist Extremity/Trunk Assessment RUE Assessment RUE Assessment: Within Functional Limits Active Range of Motion (AROM) Comments:  WFLS General Strength Comments: Strength in the shoulders and elbows at least 3+/5 but not formally assessed secondary to cervical precautions.  Grip strength WLFs. LUE Assessment LUE Assessment: Within Functional Limits Active Range of Motion (AROM) Comments: WFLs General Strength Comments: Strength in the shoulders and elbows at least 3+/5 but not formally assessed secondary to cervical precautions.  Grip strength WLFs.     Refer to Care Plan for Long Term Goals  Recommendations for other services: None    Discharge Criteria: Patient will be discharged from OT if patient refuses treatment 3 consecutive times without medical reason, if treatment goals not met, if there is a change in medical status, if patient makes no progress towards goals or if patient is discharged from hospital.  The above assessment, treatment plan, treatment alternatives and goals were discussed  and mutually agreed upon: by patient  Saramarie Stinger OTR/L 07/22/2019, 5:17 PM

## 2019-07-22 NOTE — Progress Notes (Signed)
Removed Hemovac. Pt tolerated well. Used interpretor to explain to pt that he would transferred to ip rehab. Pt asked about pain medication. It was explained that he had been given pain meds very recently and he needed to give it time to work. This nurse also noted at this time that pt had un-strapped one side of his c-collar. I again told him he has to keep it on. He did not respond to this. We then transported pt to 4M7 via hospital bed. New nurse at bedside when this nurse left. Mayford Knife RN

## 2019-07-22 NOTE — Progress Notes (Signed)
Social Work Assessment and Plan   Patient Details  Name: Charles Perez MRN: [id number] Date of Birth: 01-Sep-1985  Today's Date: 07/22/2019  Problem List:  Patient Active Problem List   Diagnosis Date Noted   Hangman's fracture ([place]) 07/21/2019   Thoracic spine fracture ([place]) 07/17/2019   Thoracic compression fracture ([place]) 07/16/2019   Past Medical History:  Past Medical History:  Diagnosis Date   Back pain due to injury    Diabetes [facility])    Past Surgical History:  Past Surgical History:  Procedure Laterality Date   POSTERIOR CERVICAL FUSION/FORAMINOTOMY N/A 07/17/2019   Procedure: POSTERIOR CERVICAL-THORACIC FUSION, CERVICAL SIX-THORACIC FOUR;  Surgeon: Kary Kos, MD;  Location: [place];  Service: Neurosurgery;  Laterality: N/A;   Social History:  reports that he has been smoking cigarettes. He has never used smokeless tobacco. He reports current alcohol use. He reports that he does not use drugs.  Family / Support Systems Marital Status: Single Patient Roles: Partner Spouse/Significant Other: Alyssa Children: None- Significant other has 61 year old son Other Supports: None Anticipated Caregiver: Significant Other Alyssa and her son Ability/Limitations of Caregiver: Significant other works. Sig. 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 Atha Starks in [place]: Yes Write: Yes Employment Status: Employed Name of Employer: [employer] of Employment: 5 Return to Work Plans: Yes when recovered Public relations account executive Issues: No   Abuse/Neglect Abuse/Neglect Assessment Can Be Completed: Yes Physical Abuse: Denies Verbal Abuse: Denies Sexual Abuse: Denies Exploitation of patient/patient's resources:  Denies Self-Neglect: Denies  Emotional Status Pt's affect, behavior and adjustment status: No Recent Psychosocial Issues: No Psychiatric History: No Substance Abuse History: Pt reports "just beer"  Patient / Family Perceptions, Expectations & Goals Pt/Family understanding of illness & functional limitations: Yes patient is understanding and knows it will be a while before returning to work and he will be tired more often Pt/family expectations/goals: Goal is to retun home wth significant other and her 36 year old son  Recruitment consultant: None Premorbid Home Care/DME Agencies: None Transportation available at discharge: Yes  Discharge Planning Living Arrangements: Spouse/significant other, Children Support Systems: Spouse/significant other, Children Type of Residence: Private residence Financial Screen Referred: Yes Living Expenses: Rent Money Management: Patient Does the patient have any problems obtaining your medications?: Yes (Describe)(Patient is uninsured and will require MATCH) Sw Barriers to Discharge Comments: Patient uninsured. May have barriers with resources. Will keep everyone updated Social Work Anticipated Follow Up Needs: HH/OP  Clinical Impression Interpreter used to complete this assessment Tito Dine [phone]) SW met with resident to introduce self, explain role and discuss d/c process. Pt has no DME. Pt is not a English as a second language teacher.   Dyanne Iha 07/22/2019, 12:26 PM

## 2019-07-22 NOTE — IPOC Note (Addendum)
Overall Plan of Care [facility]) Patient Details Name: Charles Perez MRN: [id number] DOB: 05-24-1985  Admitting Diagnosis: Quadriplegia and quadriparesis [facility])  Hospital Problems: Principal Problem:   Quadriplegia and quadriparesis (HCC) Active Problems:   Hangman's fracture (HCC)   Postoperative pain   Hyperglycemia   Hyponatremia   Leukocytosis   Acute blood loss anemia   Slow transit constipation     Functional Problem List: Nursing Safety, Medication Management, Skin Integrity, Pain  PT Balance, Behavior, Edema, Endurance, Motor, Pain, Skin Integrity  OT Balance, Cognition, Endurance, Pain, Safety  SLP    TR         Basic ADL's: OT Grooming, Bathing, Dressing, Toileting     Advanced  ADL's: OT Simple Meal Preparation     Transfers: PT Bed Mobility, Bed to Chair, Car, Furniture, Floor  OT Tub/Shower, Technical brewer: PT Ambulation, Psychologist, prison and probation services, Stairs     Additional Impairments: OT None  SLP        TR      Anticipated Outcomes Item Anticipated Outcome  Self Feeding independent  Swallowing      Basic self-care  supervision  Toileting  modified independent   Bathroom Transfers supervision  Bowel/Bladder     Transfers  Supervision assist with LRAD  Locomotion  Ambulatory with LRAD and supervision assist  Communication     Cognition     Pain  Patient stated pain level at or below patient stated pain goal.  Safety/Judgment  Patient remains free of injury and displays understanding of safety guidelones and follows precautions to request help prior to ambulation.   Therapy Plan: PT Intensity: Minimum of 1-2 x/day ,45 to 90 minutes PT Frequency: 5 out of 7 days PT Duration Estimated Length of Stay: 10-14 days OT Intensity: Minimum of 1-2 x/day, 45 to 90 minutes OT Frequency: 5 out of 7 days OT Duration/Estimated Length of Stay: 8-10 days SLP Duration/Estimated Length of Stay: n/a for ST   Due to the current state of emergency,  patients may not be receiving their 3-hours of Medicare-mandated therapy.   Team Interventions: Nursing Interventions Patient/Family Education, Pain Management, Medication Management, Skin Care/Wound Management  PT interventions Ambulation/gait training, Warden/ranger, Cognitive remediation/compensation, Community reintegration, Patient/family education, Functional electrical stimulation, Disease management/prevention, Discharge planning, DME/adaptive equipment instruction, Functional mobility training, Neuromuscular re-education, Pain management, Psychosocial support, Skin care/wound management, Splinting/orthotics, Stair training, Therapeutic Activities, Therapeutic Exercise, UE/LE Strength taining/ROM, UE/LE Coordination activities, Visual/perceptual remediation/compensation, Wheelchair propulsion/positioning  OT Interventions Balance/vestibular training, Self Care/advanced ADL retraining, Therapeutic Activities, UE/LE Coordination activities, Discharge planning, Pain management, Cognitive remediation/compensation, Functional mobility training, Patient/family education, Therapeutic Exercise, DME/adaptive equipment instruction, Neuromuscular re-education, UE/LE Strength taining/ROM, Wheelchair propulsion/positioning  SLP Interventions    TR Interventions    SW/CM Interventions Discharge Planning, Psychosocial Support, Patient/Family Education   Barriers to Discharge MD  Medical stability, Home enviroment access/loayout, Neurogenic bowel and bladder, Wound care, Lack of/limited family support, Weight, Behavior and spanish speaking; SCI  Nursing      PT Inaccessible home environment, Medical stability, Home environment access/layout, Other (comments) pain  OT      SLP      SW Inaccessible home environment, Decreased caregiver support, Medical stability, Home environment access/layout, Lack of/limited family support, Other (comments) Pt is uninsured and will have difficulty with  getting HH/OP or DME needed   Team Discharge Planning: Destination: PT-Home ,OT- Home , SLP-Home Projected Follow-up: PT-Home health PT, OT-  None, SLP-None Projected Equipment Needs: PT-Rolling walker with 5" wheels, Wheelchair  cushion (measurements), Wheelchair (measurements), OT- To be determined, SLP-None recommended by SLP Equipment Details: PT- , OT-  Patient/family involved in discharge planning: PT- Patient,  OT-Patient, SLP-Patient  MD ELOS: 10-14 days Medical Rehab Prognosis:  Good Assessment: Pt is a 34 yr old male with hangman's fx s/p SCI ASIA D- with DM and poorly controlled BGs, started on metformin, on oxy IR, and checking PVRs to see if has neurogenic bladder, bowel as well.  Goals- supervision    See Team Conference Notes for weekly updates to the plan of care

## 2019-07-23 ENCOUNTER — Inpatient Hospital Stay (HOSPITAL_COMMUNITY): Payer: Self-pay | Admitting: Occupational Therapy

## 2019-07-23 ENCOUNTER — Inpatient Hospital Stay (HOSPITAL_COMMUNITY): Payer: Self-pay | Admitting: Physical Therapy

## 2019-07-23 DIAGNOSIS — E871 Hypo-osmolality and hyponatremia: Secondary | ICD-10-CM | POA: Diagnosis present

## 2019-07-23 DIAGNOSIS — R739 Hyperglycemia, unspecified: Secondary | ICD-10-CM

## 2019-07-23 DIAGNOSIS — D72829 Elevated white blood cell count, unspecified: Secondary | ICD-10-CM

## 2019-07-23 DIAGNOSIS — D62 Acute posthemorrhagic anemia: Secondary | ICD-10-CM

## 2019-07-23 DIAGNOSIS — G8918 Other acute postprocedural pain: Secondary | ICD-10-CM

## 2019-07-23 LAB — GLUCOSE, CAPILLARY
Glucose-Capillary: 245 mg/dL — ABNORMAL HIGH (ref 70–99)
Glucose-Capillary: 276 mg/dL — ABNORMAL HIGH (ref 70–99)
Glucose-Capillary: 202 mg/dL — ABNORMAL HIGH (ref 70–99)
Glucose-Capillary: 182 mg/dL — ABNORMAL HIGH (ref 70–99)

## 2019-07-23 NOTE — Progress Notes (Signed)
Physical Therapy Session Note  Patient Details  Name: Charles Perez MRN: [id number] Date of Birth: 31-Dec-1985  Today's Date: 07/23/2019 PT Individual Time: 1002-1057 PT Individual Time Calculation (min): 55 min   Short Term Goals: Week 1:  PT Short Term Goal 1 (Week 1): Pt will transfer to [facility] with CGA PT Short Term Goal 2 (Week 1): Pt will ambulate 30f with supervision assist PT Short Term Goal 3 (Week 1): Pt will initiate stair training. PT Short Term Goal 4 (Week 1): pt will remain out if bed 1 hour between therapies  Skilled Therapeutic Interventions/Progress Updates:   Pt received supine in bed and agreeable to PT. Rolling R and L to don Cervical/thoracic brace. Supine>sit transfer with CGA and min cues. PT adjusted brace to improve support on occipital bone and reduce pressure on incision. [facility], mild posterior bias. Attempted WC mobility, but unable due to pain in the neck/ upper back. Gait training with RW x 643fwith min-CGA from PT for safety. Stair management training for up/down 2 steps ( 6in) with min assist for safety and cues for step to gait pattern. Pt declined to attempt car transfers. BLE reciprocal strengthening on kinetron 3 x 1 min with cues for full ROM. Pt then requesting to return to bed due to pain in back. Pt returned to room and performed ambulatory transfer to bed with RW and CGA. Sit>supine completed with min assist for RLE control. Rolling R and L to doff brace with supervision assist. Pt left supine in bed with call bell in reach and all needs met.          Therapy Documentation Precautions:  Precautions Precautions: Fall, Cervical, Back Precaution Comments: brace must be donned in supine Required Braces or Orthoses: Cervical Brace, Spinal Brace Cervical Brace: Hard collar, At all times Spinal Brace: Other (comment) Spinal Brace Comments: Brace can be removed in bed Restrictions Weight Bearing Restrictions:  Yes Pain: Pain Assessment Pain Scale: 0-10 Pain Score: 7  Pain Type: Acute pain;Surgical pain Pain Location: Neck Pain Descriptors / Indicators: Aching Pain Frequency: Intermittent Pain Onset: On-going Pain Intervention(s): Medication (See eMAR)    Therapy/Group: Individual Therapy  AuLorie Phenix/27/2021, 12:35 PM

## 2019-07-23 NOTE — Progress Notes (Signed)
[facility] PHYSICAL MEDICINE & REHABILITATION PROGRESS NOTE   Subjective/Complaints: Patient seen laying in bed this morning.  Interpreter present.  Patient states he slept well overnight.  He states he had a good first day of therapies yesterday.  He states he has a headache this morning, but?  Received medications.  ROS: Denies SOB, CP, N/V/D  Objective:   No results found. Recent Labs    07/22/19 0537  WBC 12.2*  HGB 11.3*  HCT 31.9*  PLT 387   Recent Labs    07/22/19 0537  NA 128*  K 3.7  CL 89*  CO2 28  GLUCOSE 262*  BUN 6  CREATININE 0.63  CALCIUM 8.4*    Intake/Output Summary (Last 24 hours) at 07/23/2019 1039 Last data filed at 07/23/2019 0820 Gross per 24 hour  Intake 120 ml  Output --  Net 120 ml     Physical Exam: Vital Signs Blood pressure 110/68, pulse (!) 106, temperature 98.9 F (37.2 C), temperature source Oral, resp. rate 20, height 5\' 9"  (1.753 m), weight 96.9 kg, SpO2 95 %. Constitutional: No distress . Vital signs reviewed. HENT: Normocephalic.  Atraumatic.  + C-collar. Eyes: EOMI. No discharge. Cardiovascular: No JVD. Respiratory: Normal effort.  No stridor. GI: Non-distended. Skin: Warm and dry.  Intact. Psych: Normal mood.  Normal behavior.  Some confusion noted. Musc: No edema in extremities.  No tenderness in extremities. Neuro: Alert Bilateral upper extremities: 4+/5 proximal distal Bilateral lower extremities: 4+/5 proximal distal  Assessment/Plan: 1. Functional deficits secondary to Hangman's fracture and resulting SCI  which require 3+ hours per day of interdisciplinary therapy in a comprehensive inpatient rehab setting.  Physiatrist is providing close team supervision and 24 hour management of active medical problems listed below.  Physiatrist and rehab team continue to assess barriers to discharge/monitor patient progress toward functional and medical goals  Care Tool:  Bathing    Body parts bathed by patient: Right  arm, Left arm, Chest, Abdomen, Front perineal area, Right upper leg, Left upper leg, Face   Body parts bathed by helper: Right lower leg, Left lower leg, Buttocks     Bathing assist Assist Level: Minimal Assistance - Patient > 75%     Upper Body Dressing/Undressing Upper body dressing Upper body dressing/undressing activity did not occur (including orthotics): N/A What is the patient wearing?: Hospital gown only    Upper body assist Assist Level: Maximal Assistance - Patient 25 - 49%    Lower Body Dressing/Undressing Lower body dressing      What is the patient wearing?: Pants     Lower body assist Assist for lower body dressing: Maximal Assistance - Patient 25 - 49%     Toileting Toileting    Toileting assist Assist for toileting: Minimal Assistance - Patient > 75%     Transfers Chair/bed transfer  Transfers assist     Chair/bed transfer assist level: Minimal Assistance - Patient > 75%     Locomotion Ambulation   Ambulation assist      Assist level: Minimal Assistance - Patient > 75% Assistive device: Walker-rolling Max distance: 15   Walk 10 feet activity   Assist     Assist level: Minimal Assistance - Patient > 75% Assistive device: Walker-rolling   Walk 50 feet activity   Assist Walk 50 feet with 2 turns activity did not occur: Refused         Walk 150 feet activity   Assist Walk 150 feet activity did not occur: Refused  Walk 10 feet on uneven surface  activity   Assist           Wheelchair     Assist Will patient use wheelchair at discharge?: Yes Type of Wheelchair: Manual Wheelchair activity did not occur: Refused         Wheelchair 50 feet with 2 turns activity    Assist    Wheelchair 50 feet with 2 turns activity did not occur: Refused       Wheelchair 150 feet activity     Assist  Wheelchair 150 feet activity did not occur: Refused       Blood pressure 110/68, pulse (!) 106,  temperature 98.9 F (37.2 C), temperature source Oral, resp. rate 20, height 5\' 9"  (1.753 m), weight 96.9 kg, SpO2 95 %.  Medical Problem List and Plan: 1.Decreased functional mobilitysecondary to SCI/hangman's fractureT1 fracture dislocation T3 compression deformity/status post C6-T4 pedicle screw fixation 07/17/2019.CTOcollar -patient mayNot shower- has to keep Cervical collar and thoracic extension in place at all times  Continue CIR  2. Antithrombotics: -DVT/anticoagulation:Lovenox  Dopplers pending -antiplatelet therapy: N/A 3. Pain Management:Oxycodone and Flexeril as needed  Changed oxy to 5-10 mg q3 hours prn since pt was supposedly confused today  ?  Continue to have some confusion 4. Mood:Provide emotional support -antipsychotic agents: N/A 5. Neuropsych: This patientiscapable of making decisions on hisown behalf. 6. Skin/Wound Care:Routine skin checks 7. Fluids/Electrolytes/Nutrition:Routine in and outs 8. Hyperglycemia. Noted history of diabetes mellitus on no medications at admission.   Hemoglobin A1c ordered for Monday  Metformin started on 3/26  Elevated on 3/27 9. Tobacco abuse. Counseling 10. Tachycardia- mild  11. Hypoxia-   Improving 12. Neurogenic bowel and bladder?  PVRs order x3, pending  13. Hyponatremia  Sodium 128 on 3/26  Labs ordered for Monday 14.  Leukocytosis  WBCs 12.2 on 3/26  Labs ordered for Monday 15.  Acute blood loss anemia  Hemoglobin 11.3 on 3/26  Labs ordered for Monday  LOS: 2 days A FACE TO FACE EVALUATION WAS PERFORMED  Carisma Troupe Thursday 07/23/2019, 10:39 AM

## 2019-07-23 NOTE — Plan of Care (Signed)
  Problem: Consults Goal: RH GENERAL PATIENT EDUCATION Description: See Patient Education module for education specifics. Outcome: Progressing Goal: Skin Care Protocol Initiated - if Braden Score 18 or less Description: If consults are not indicated, leave blank or document N/A Outcome: Progressing Goal: Nutrition Consult-if indicated Outcome: Progressing Goal: Diabetes Guidelines if Diabetic/Glucose > 140 Description: If diabetic or lab glucose is > 140 mg/dl - Initiate Diabetes/Hyperglycemia Guidelines & Document Interventions  Outcome: Progressing   Problem: RH BOWEL ELIMINATION Goal: RH STG MANAGE BOWEL WITH ASSISTANCE Description: STG Manage Bowel with Assistance. Outcome: Progressing Goal: RH STG MANAGE BOWEL W/MEDICATION W/ASSISTANCE Description: STG Manage Bowel with Medication with Assistance. Outcome: Progressing   Problem: RH BLADDER ELIMINATION Goal: RH STG MANAGE BLADDER WITH ASSISTANCE Description: STG Manage Bladder With Assistance Outcome: Progressing Goal: RH STG MANAGE BLADDER WITH MEDICATION WITH ASSISTANCE Description: STG Manage Bladder With Medication With Assistance. Outcome: Progressing Goal: RH STG MANAGE BLADDER WITH EQUIPMENT WITH ASSISTANCE Description: STG Manage Bladder With Equipment With Assistance Outcome: Progressing   Problem: RH SKIN INTEGRITY Goal: RH STG SKIN FREE OF INFECTION/BREAKDOWN Outcome: Progressing Goal: RH STG MAINTAIN SKIN INTEGRITY WITH ASSISTANCE Description: STG Maintain Skin Integrity With Assistance. Outcome: Progressing Goal: RH STG ABLE TO PERFORM INCISION/WOUND CARE W/ASSISTANCE Description: STG Able To Perform Incision/Wound Care With Assistance. Outcome: Progressing   Problem: RH SAFETY Goal: RH STG ADHERE TO SAFETY PRECAUTIONS W/ASSISTANCE/DEVICE Description: STG Adhere to Safety Precautions With Assistance/Device. Outcome: Progressing Goal: RH STG DECREASED RISK OF FALL WITH ASSISTANCE Description: STG  Decreased Risk of Fall With Assistance. Outcome: Progressing   Problem: RH PAIN MANAGEMENT Goal: RH STG PAIN MANAGED AT OR BELOW PT'S PAIN GOAL Outcome: Progressing   Problem: RH KNOWLEDGE DEFICIT GENERAL Goal: RH STG INCREASE KNOWLEDGE OF SELF CARE AFTER HOSPITALIZATION Outcome: Progressing   

## 2019-07-23 NOTE — Progress Notes (Signed)
Occupational Therapy Session Note  Patient Details  Name: Charles Perez MRN: [id number] Date of Birth: [birth year]  Today's Date: 07/23/2019 OT Individual Time: 4401-0272 and 5366-4403 and 4742-5956 OT Individual Time Calculation (min): 40 min and 23 and 40 min  Short Term Goals: Week 1:  OT Short Term Goal 1 (Week 1): STGS equal to LTGs set at supervision to modified independent.  Skilled Therapeutic Interventions/Progress Updates:    Pt greeted in bed with c/o back pain. Per RN, pt premedicated for pain. He declined toileting, oral care, or changing into clean clothes. He also declined getting OOB for tx. Pt agreeable to complete UB bathing and dressing bedlevel with min cuing. Logrolling to Lt>Rt completed with Min A using the bedrails for OT to remove one of his hospital gowns and readjust chuck pad. Pt reported that a control in the bed was "moving" beneath him and became very distracted by this. His call bell was placed at his side. He rolled again for OT to assess if there was something under him but found nothing. When pt returned to supine again he reported he felt better. +2 for boosting pt up in bed for improved alignment. Reviewed his therapy schedule, strongly encouraging him to get OOB for his other PT and OT sessions today. Left pt with all needs within reach and bed alarm set.  Interpretor present during session  2nd Session 1:1 tx (23 min) Pt greeted in bed with c/o 4/10 pain in back. RN in at start of session to provide pain medicine. We discussed importance of holistic pain mgt strategies, such as listening to meaningful music and other therapeutic distractions to use in conjunction with prescribed pain medicine. Discussed with RN order of k-pad for pain mgt as well. Provided max encouragement for OOB therapy, however pt stated his pain was too intense for OOB transfer. Educated pt on the 0-10 pain scale, with pt reporting he told RN that his pain was 7/10 vs 4/10 earlier. Per RN,  pt stated 4/10. With encouragement, pt willing to attempt OOB transfer if OT returned in about twenty minutes. Left him with all needs within reach and bed alarm set.  Interpretor present during session   3rd Session 1:1 tx (40 min) Pt greeted in bed, asking if he could have something stronger for pain. Educated pt that he just received his pain medicine, strongly encouraged OOB tx with pt finally willing to engage in AE training while sitting EOB. He rolled Rt>Lt with supervision while OT donned his brace. Afterwards pt declined to sit EOB due to pain. OT demonstrated to pt use of sock aide and asked him to set up the device like OT had just shown him. Pt refused. Then he requested to get up and use the bathroom. CGA for supine<sit and for ambulatory transfer to the toilet using RW. Pt completed clothing mgt and hygiene with Min A overall. OT completed perihygiene in the back for thoroughness and cleanliness post void. He then returned to bed and transitioned to supine with assist for managing his legs. He rolled Rt>Lt in the same manner as written above for OT to doff his brace. Pt needed vcs to uncap sanitizer before attempting to use it. Left pt in bed with all needs within reach and bed alarm set.  Max A for recall of back precautions during session.  Interpretor present during session   Therapy Documentation Precautions:  Precautions Precautions: Fall, Cervical, Back Precaution Comments: brace must be donned in supine Required Braces or  Orthoses: Cervical Brace, Spinal Brace Cervical Brace: Hard collar, At all times Spinal Brace: Other (comment) Spinal Brace Comments: Brace can be removed in bed Restrictions Weight Bearing Restrictions: Yes ADL: ADL Eating: Independent Where Assessed-Eating: Bed level Grooming: Supervision/safety Where Assessed-Grooming: Edge of bed Upper Body Bathing: Supervision/safety Where Assessed-Upper Body Bathing: Bed level Lower Body Bathing: Maximal  assistance Where Assessed-Lower Body Bathing: Edge of bed Upper Body Dressing: Maximal assistance Where Assessed-Upper Body Dressing: Bed level Lower Body Dressing: Maximal assistance Where Assessed-Lower Body Dressing: Edge of bed Toileting: Minimal assistance Where Assessed-Toileting: Teacher, adult education: Curator Method: Proofreader: Bedside commode     Therapy/Group: Individual Therapy  Rubyann Lingle A Austin Herd 07/23/2019, 4:14 PM

## 2019-07-24 ENCOUNTER — Inpatient Hospital Stay (HOSPITAL_COMMUNITY): Payer: Self-pay

## 2019-07-24 DIAGNOSIS — M7989 Other specified soft tissue disorders: Secondary | ICD-10-CM

## 2019-07-24 DIAGNOSIS — K5901 Slow transit constipation: Secondary | ICD-10-CM

## 2019-07-24 LAB — GLUCOSE, CAPILLARY
Glucose-Capillary: 213 mg/dL — ABNORMAL HIGH (ref 70–99)
Glucose-Capillary: 217 mg/dL — ABNORMAL HIGH (ref 70–99)
Glucose-Capillary: 146 mg/dL — ABNORMAL HIGH (ref 70–99)
Glucose-Capillary: 266 mg/dL — ABNORMAL HIGH (ref 70–99)

## 2019-07-24 MED ORDER — METFORMIN HCL 500 MG PO TABS
1000.0000 mg | ORAL_TABLET | Freq: Two times a day (BID) | ORAL | Status: DC
  Administered 2019-07-24 – 2019-07-30 (×12): 1000 mg via ORAL
  Filled 2019-07-24 (×12): qty 2

## 2019-07-24 MED ORDER — POLYETHYLENE GLYCOL 3350 17 G PO PACK
17.0000 g | PACK | Freq: Two times a day (BID) | ORAL | Status: DC
  Administered 2019-07-24 – 2019-07-30 (×13): 17 g via ORAL
  Filled 2019-07-24 (×12): qty 1

## 2019-07-24 MED ORDER — METHOCARBAMOL 750 MG PO TABS
750.0000 mg | ORAL_TABLET | Freq: Three times a day (TID) | ORAL | Status: DC
  Administered 2019-07-24 – 2019-07-30 (×19): 750 mg via ORAL
  Filled 2019-07-24 (×19): qty 1

## 2019-07-24 NOTE — Progress Notes (Signed)
[name] PHYSICAL MEDICINE & REHABILITATION PROGRESS NOTE   Subjective/Complaints: Patient seen sitting up in bed this morning.  He states he slept fairly overnight due to hospital noises.  Complains of posterior neck pain.  Discussed with nursing neck pain.  He appears less confused this morning.  ROS: + Posterior neck pain.  Denies SOB, CP, N/V/D  Objective:   No results found. Recent Labs    07/22/19 0537  WBC 12.2*  HGB 11.3*  HCT 31.9*  PLT 387   Recent Labs    07/22/19 0537  NA 128*  K 3.7  CL 89*  CO2 28  GLUCOSE 262*  BUN 6  CREATININE 0.63  CALCIUM 8.4*    Intake/Output Summary (Last 24 hours) at 07/24/2019 0913 Last data filed at 07/23/2019 1949 Gross per 24 hour  Intake 0 ml  Output 300 ml  Net -300 ml     Physical Exam: Vital Signs Blood pressure 110/68, pulse (!) 106, temperature 98.9 F (37.2 C), temperature source Oral, resp. rate 20, height 5\' 9"  (1.753 m), weight 96.9 kg, SpO2 95 %. Constitutional: No distress . Vital signs reviewed. HENT: Normocephalic.  Atraumatic. Eyes: EOMI. No discharge. Cardiovascular: No JVD. Respiratory: Normal effort.  No stridor. GI: Non-distended. Skin: Warm and dry.  Intact. Psych: Normal mood.  Normal behavior. Musc: Tender to palpation along posterior neck muscles Neuro: Alert Bilateral upper extremities: 4+/5 proximal distal, unchanged Bilateral lower extremities: 4+/5 proximal distal, unchanged  Assessment/Plan: 1. Functional deficits secondary to Hangman's fracture and resulting SCI  which require 3+ hours per day of interdisciplinary therapy in a comprehensive inpatient rehab setting.  Physiatrist is providing close team supervision and 24 hour management of active medical problems listed below.  Physiatrist and rehab team continue to assess barriers to discharge/monitor patient progress toward functional and medical goals  Care Tool:  Bathing    Body parts bathed by patient: Right arm, Left arm,  Chest, Abdomen, Front perineal area, Right upper leg, Left upper leg, Face   Body parts bathed by helper: Right lower leg, Left lower leg, Buttocks     Bathing assist Assist Level: Minimal Assistance - Patient > 75%     Upper Body Dressing/Undressing Upper body dressing Upper body dressing/undressing activity did not occur (including orthotics): N/A What is the patient wearing?: Hospital gown only    Upper body assist Assist Level: Maximal Assistance - Patient 25 - 49%    Lower Body Dressing/Undressing Lower body dressing      What is the patient wearing?: Pants     Lower body assist Assist for lower body dressing: Maximal Assistance - Patient 25 - 49%     Toileting Toileting    Toileting assist Assist for toileting: Minimal Assistance - Patient > 75%(urinal)     Transfers Chair/bed transfer  Transfers assist     Chair/bed transfer assist level: Minimal Assistance - Patient > 75%     Locomotion Ambulation   Ambulation assist      Assist level: Minimal Assistance - Patient > 75% Assistive device: Walker-rolling Max distance: 15   Walk 10 feet activity   Assist     Assist level: Minimal Assistance - Patient > 75% Assistive device: Walker-rolling   Walk 50 feet activity   Assist Walk 50 feet with 2 turns activity did not occur: Refused         Walk 150 feet activity   Assist Walk 150 feet activity did not occur: Refused  Walk 10 feet on uneven surface  activity   Assist           Wheelchair     Assist Will patient use wheelchair at discharge?: Yes Type of Wheelchair: Manual Wheelchair activity did not occur: Refused         Wheelchair 50 feet with 2 turns activity    Assist    Wheelchair 50 feet with 2 turns activity did not occur: Refused       Wheelchair 150 feet activity     Assist  Wheelchair 150 feet activity did not occur: Refused       Blood pressure 110/68, pulse (!) 106, temperature  98.9 F (37.2 C), temperature source Oral, resp. rate 20, height 5\' 9"  (1.753 m), weight 96.9 kg, SpO2 95 %.  Medical Problem List and Plan: 1.Decreased functional mobilitysecondary to SCI/hangman's fractureT1 fracture dislocation T3 compression deformity/status post C6-T4 pedicle screw fixation 07/17/2019.CTOcollar Patient maynot shower- has to keep Cervical collar and thoracic extension in place at all times, reminded patient.  Continue CIR  2. Antithrombotics: -DVT/anticoagulation:Lovenox  Dopplers pending -antiplatelet therapy: N/A 3. Pain Management:Oxycodone  Changed oxy to 5-10 mg q3 hours prn since pt was supposedly confused today  Pain appears to be predominantly muscular-Robaxin scheduled on 3/28 4. Mood:Provide emotional support -antipsychotic agents: N/A 5. Neuropsych: This patientiscapable of making decisions on hisown behalf. 6. Skin/Wound Care:Routine skin checks 7. Fluids/Electrolytes/Nutrition:Routine in and outs 8. Hyperglycemia, likely DM. Noted history of diabetes mellitus on no medications at admission.   Hemoglobin A1c ordered for tomorrow  Metformin started on 3/26, increased on 3/28  Elevated on 3/28 9. Tobacco abuse. Counseling 10. Tachycardia- mild  11. Hypoxia-   Improving 12. Neurogenic bowel and bladder?  PVRs order x3, pending on 3/28, however appears to be voiding without difficulty  No documented bowel movement, meds increased on 3/28, continue further adjustments as necessary  13. Hyponatremia  Sodium 128 on 3/26  Labs ordered for tomorrow 14.  Leukocytosis  WBCs 12.2 on 3/26  Labs ordered for tomorrpw 15.  Acute blood loss anemia  Hemoglobin 11.3 on 3/26  Labs ordered for tomorrow  LOS: 3 days A FACE TO FACE EVALUATION WAS PERFORMED  Charles Perez Charles Perez 07/24/2019, 9:13 AM

## 2019-07-24 NOTE — Progress Notes (Signed)
Discussed patients pain frequency with on call provider. Verbal order for kpad & robaxin 750mg  TID given. Nurse in charge of patient made aware

## 2019-07-24 NOTE — Progress Notes (Signed)
VASCULAR LAB PRELIMINARY  PRELIMINARY  PRELIMINARY  PRELIMINARY  Bilateral lower extremity venous duplex completed.    Preliminary report:  See CV proc for preliminary results.   Yeraldy Spike, RVT 07/24/2019, 10:54 AM

## 2019-07-25 ENCOUNTER — Inpatient Hospital Stay (HOSPITAL_COMMUNITY): Payer: Self-pay | Admitting: Physical Therapy

## 2019-07-25 ENCOUNTER — Inpatient Hospital Stay (HOSPITAL_COMMUNITY): Payer: Self-pay

## 2019-07-25 DIAGNOSIS — S12130S Unspecified traumatic displaced spondylolisthesis of second cervical vertebra, sequela: Secondary | ICD-10-CM

## 2019-07-25 DIAGNOSIS — K5901 Slow transit constipation: Secondary | ICD-10-CM

## 2019-07-25 DIAGNOSIS — E871 Hypo-osmolality and hyponatremia: Secondary | ICD-10-CM

## 2019-07-25 DIAGNOSIS — G8918 Other acute postprocedural pain: Secondary | ICD-10-CM

## 2019-07-25 DIAGNOSIS — R739 Hyperglycemia, unspecified: Secondary | ICD-10-CM

## 2019-07-25 DIAGNOSIS — G8250 Quadriplegia, unspecified: Secondary | ICD-10-CM | POA: Diagnosis present

## 2019-07-25 LAB — CBC WITH DIFFERENTIAL/PLATELET
Abs Immature Granulocytes: 0.16 10*3/uL — ABNORMAL HIGH (ref 0.00–0.07)
Basophils Absolute: 0.1 10*3/uL (ref 0.0–0.1)
Basophils Relative: 1 %
Eosinophils Absolute: 0.3 10*3/uL (ref 0.0–0.5)
Eosinophils Relative: 3 %
HCT: 32.8 % — ABNORMAL LOW (ref 39.0–52.0)
Hemoglobin: 11.3 g/dL — ABNORMAL LOW (ref 13.0–17.0)
Immature Granulocytes: 2 %
Lymphocytes Relative: 25 %
Lymphs Abs: 2.3 10*3/uL (ref 0.7–4.0)
MCH: 32.8 pg (ref 26.0–34.0)
MCHC: 34.5 g/dL (ref 30.0–36.0)
MCV: 95.1 fL (ref 80.0–100.0)
Monocytes Absolute: 1.2 10*3/uL — ABNORMAL HIGH (ref 0.1–1.0)
Monocytes Relative: 13 %
Neutro Abs: 5.3 10*3/uL (ref 1.7–7.7)
Neutrophils Relative %: 56 %
Platelets: 509 10*3/uL — ABNORMAL HIGH (ref 150–400)
RBC: 3.45 MIL/uL — ABNORMAL LOW (ref 4.22–5.81)
RDW: 12.1 % (ref 11.5–15.5)
WBC: 9.3 10*3/uL (ref 4.0–10.5)
nRBC: 0 % (ref 0.0–0.2)

## 2019-07-25 LAB — BASIC METABOLIC PANEL
Anion gap: 12 (ref 5–15)
BUN: 7 mg/dL (ref 6–20)
CO2: 28 mmol/L (ref 22–32)
Calcium: 8.8 mg/dL — ABNORMAL LOW (ref 8.9–10.3)
Chloride: 89 mmol/L — ABNORMAL LOW (ref 98–111)
Creatinine, Ser: 0.67 mg/dL (ref 0.61–1.24)
GFR calc Af Amer: >60 mL/min (ref >60)
GFR calc non Af Amer: >60 mL/min (ref >60)
Glucose, Bld: 234 mg/dL — ABNORMAL HIGH (ref 70–99)
Potassium: 3.8 mmol/L (ref 3.5–5.1)
Sodium: 129 mmol/L — ABNORMAL LOW (ref 135–145)

## 2019-07-25 LAB — HEMOGLOBIN A1C
Hgb A1c MFr Bld: 9.3 % — ABNORMAL HIGH (ref 4.8–5.6)
Mean Plasma Glucose: 220.21 mg/dL

## 2019-07-25 LAB — GLUCOSE, CAPILLARY
Glucose-Capillary: 182 mg/dL — ABNORMAL HIGH (ref 70–99)
Glucose-Capillary: 216 mg/dL — ABNORMAL HIGH (ref 70–99)
Glucose-Capillary: 181 mg/dL — ABNORMAL HIGH (ref 70–99)
Glucose-Capillary: 147 mg/dL — ABNORMAL HIGH (ref 70–99)

## 2019-07-25 MED ORDER — OXYCODONE HCL 5 MG PO TABS
5.0000 mg | ORAL_TABLET | ORAL | Status: DC | PRN
  Administered 2019-07-25 (×2): 15 mg via ORAL
  Administered 2019-07-25: 10 mg via ORAL
  Administered 2019-07-25: 5 mg via ORAL
  Administered 2019-07-26 (×3): 10 mg via ORAL
  Administered 2019-07-26 – 2019-07-27 (×7): 15 mg via ORAL
  Administered 2019-07-28: 5 mg via ORAL
  Administered 2019-07-28: 15 mg via ORAL
  Administered 2019-07-29: 10 mg via ORAL
  Administered 2019-07-29: 15 mg via ORAL
  Administered 2019-07-29 – 2019-07-30 (×2): 10 mg via ORAL
  Filled 2019-07-25: qty 3
  Filled 2019-07-25: qty 2
  Filled 2019-07-25 (×5): qty 3
  Filled 2019-07-25: qty 2
  Filled 2019-07-25 (×3): qty 3
  Filled 2019-07-25 (×3): qty 2
  Filled 2019-07-25: qty 3
  Filled 2019-07-25: qty 1
  Filled 2019-07-25: qty 2
  Filled 2019-07-25: qty 3
  Filled 2019-07-25: qty 2
  Filled 2019-07-25 (×2): qty 3

## 2019-07-25 MED ORDER — TRAZODONE HCL 50 MG PO TABS
50.0000 mg | ORAL_TABLET | Freq: Every day | ORAL | Status: DC
  Administered 2019-07-25: 50 mg via ORAL
  Filled 2019-07-25: qty 1

## 2019-07-25 NOTE — Care Management (Signed)
[facility] Individual Statement of Services  Patient Name:  Charles Perez  Date:  07/25/2019  Welcome to the [facility].  Our goal is to provide you with an individualized program based on your diagnosis and situation, designed to meet your specific needs.  With this comprehensive rehabilitation program, you will be expected to participate in at least 3 hours of rehabilitation therapies Monday-Friday, with modified therapy programming on the weekends.  Your rehabilitation program will include the following services:  Physical Therapy (PT), Occupational Therapy (OT), Speech Therapy (ST), 24 hour per day rehabilitation nursing, Therapeutic Recreaction (TR), Neuropsychology, Case Management (Social Worker), Rehabilitation Medicine, Nutrition Services, Pharmacy Services and Other  Weekly team conferences will be held on Tuesday to discuss your progress.  Your Social Worker will talk with you frequently to get your input and to update you on team discussions.  Team conferences with you and your family in attendance may also be held.  Expected length of stay: 10-14 Days  Overall anticipated outcome:Supervision Assit  Depending on your progress and recovery, your program may change. Your Social Worker will coordinate services and will keep you informed of any changes. Your Social Worker's name and contact numbers are listed  below.  The following services may also be recommended but are not provided by the [facility]:    Home Health Rehabiltiation Services  Outpatient Rehabilitation Services    Arrangements will be made to provide these services after discharge if needed.  Arrangements include referral to agencies that provide these services.  Your insurance has been verified to be:  Uninsured Your primary doctor is:  No PCP  Pertinent information will be shared with your doctor and your insurance company.  Social Worker:   Lavera Guise, [place] [phone] or ([phone]   Information discussed with and copy given to patient by: Andria Rhein, 07/25/2019, 9:13 AM

## 2019-07-25 NOTE — Progress Notes (Signed)
[facility] PHYSICAL MEDICINE & REHABILITATION PROGRESS NOTE   Subjective/Complaints: Pt reported was tired but didn't sleep well. Is "so-so".  Thinks I need to increase meds- Robaxin was scheduled this weekend    Also due ot poor sleep, wants something meds wise for sleep.     ROS:   Pt denies SOB, abd pain, CP, N/V/C/D, and vision changes   Objective:   VAS [place] LOWER EXTREMITY VENOUS (DVT)  Result Date: 07/24/2019  Lower Venous DVTStudy Indications: Trauma/Rehab.  Comparison Study: No prior study on file Performing Technologist: Sharion Dove RVS  Examination Guidelines: A complete evaluation includes B-mode imaging, spectral Doppler, color Doppler, and power Doppler as needed of all accessible portions of each vessel. Bilateral testing is considered an integral part of a complete examination. Limited examinations for reoccurring indications may be performed as noted. The reflux portion of the exam is performed with the patient in reverse Trendelenburg.  +---------+---------------+---------+-----------+----------+--------------+ RIGHT    CompressibilityPhasicitySpontaneityPropertiesThrombus Aging +---------+---------------+---------+-----------+----------+--------------+ CFV      Full           Yes      Yes                                 +---------+---------------+---------+-----------+----------+--------------+ SFJ      Full                                                        +---------+---------------+---------+-----------+----------+--------------+ FV Prox  Full                                                        +---------+---------------+---------+-----------+----------+--------------+ FV Mid   Full                                                        +---------+---------------+---------+-----------+----------+--------------+ FV DistalFull                                                         +---------+---------------+---------+-----------+----------+--------------+ PFV      Full                                                        +---------+---------------+---------+-----------+----------+--------------+ POP      Full           Yes      Yes                                 +---------+---------------+---------+-----------+----------+--------------+ PTV      Full                                                        +---------+---------------+---------+-----------+----------+--------------+  PERO     Full                                                        +---------+---------------+---------+-----------+----------+--------------+   +---------+---------------+---------+-----------+----------+--------------+ LEFT     CompressibilityPhasicitySpontaneityPropertiesThrombus Aging +---------+---------------+---------+-----------+----------+--------------+ CFV      Full           Yes      Yes                                 +---------+---------------+---------+-----------+----------+--------------+ SFJ      Full                                                        +---------+---------------+---------+-----------+----------+--------------+ FV Prox  Full                                                        +---------+---------------+---------+-----------+----------+--------------+ FV Mid   Full                                                        +---------+---------------+---------+-----------+----------+--------------+ FV DistalFull                                                        +---------+---------------+---------+-----------+----------+--------------+ PFV      Full                                                        +---------+---------------+---------+-----------+----------+--------------+ POP      Full           Yes      Yes                                  +---------+---------------+---------+-----------+----------+--------------+ PTV      Full                                                        +---------+---------------+---------+-----------+----------+--------------+ PERO     Full                                                        +---------+---------------+---------+-----------+----------+--------------+  Summary: BILATERAL: - No evidence of deep vein thrombosis seen in the lower extremities, bilaterally.   *See table(s) above for measurements and observations. Electronically signed by Waverly Ferrari MD on 07/24/2019 at 1:46:06 PM.    Final    Recent Labs    07/25/19 0726  WBC 9.3  HGB 11.3*  HCT 32.8*  PLT 509*   Recent Labs    07/25/19 0726  NA 129*  K 3.8  CL 89*  CO2 28  GLUCOSE 234*  BUN 7  CREATININE 0.67  CALCIUM 8.8*    Intake/Output Summary (Last 24 hours) at 07/25/2019 1032 Last data filed at 07/25/2019 0617 Gross per 24 hour  Intake 420 ml  Output 775 ml  Net -355 ml     Physical Exam: Vital Signs Blood pressure (!) 118/97, pulse 94, temperature 99.1 F (37.3 C), resp. rate 20, height 5\' 9"  (1.753 m), weight 96.9 kg, SpO2 96 %. Gen: awake, alert, more awake, still doesn't say anything unless asked, have interpretor, NAD CV: RRR no JVD HEENT_ not wearing collar since flat, supine Pulm: CTA B/L- good air movement GI: soft, NT,ND (+)BS Skin: Warm and dry.  Intact. Psych: Normal mood.  Normal behavior. Musc: Tender to palpation along posterior neck muscles Neuro: Alert Bilateral upper extremities: 4+/5 proximal distal, unchanged Bilateral lower extremities: 4+/5 proximal distal, unchanged  Assessment/Plan: 1. Functional deficits secondary to Hangman's fracture and resulting SCI  which require 3+ hours per day of interdisciplinary therapy in a comprehensive inpatient rehab setting.  Physiatrist is providing close team supervision and 24 hour management of active medical problems  listed below.  Physiatrist and rehab team continue to assess barriers to discharge/monitor patient progress toward functional and medical goals  Care Tool:  Bathing    Body parts bathed by patient: Right arm, Left arm, Chest, Abdomen, Front perineal area, Right upper leg, Left upper leg, Face   Body parts bathed by helper: Right lower leg, Left lower leg, Buttocks     Bathing assist Assist Level: Minimal Assistance - Patient > 75%     Upper Body Dressing/Undressing Upper body dressing Upper body dressing/undressing activity did not occur (including orthotics): N/A What is the patient wearing?: Hospital gown only    Upper body assist Assist Level: Maximal Assistance - Patient 25 - 49%    Lower Body Dressing/Undressing Lower body dressing      What is the patient wearing?: Pants     Lower body assist Assist for lower body dressing: Maximal Assistance - Patient 25 - 49%     Toileting Toileting    Toileting assist Assist for toileting: Minimal Assistance - Patient > 75%(urinal)     Transfers Chair/bed transfer  Transfers assist     Chair/bed transfer assist level: Minimal Assistance - Patient > 75%     Locomotion Ambulation   Ambulation assist      Assist level: Minimal Assistance - Patient > 75% Assistive device: Walker-rolling Max distance: 20'   Walk 10 feet activity   Assist     Assist level: Minimal Assistance - Patient > 75% Assistive device: Walker-rolling   Walk 50 feet activity   Assist Walk 50 feet with 2 turns activity did not occur: Safety/medical concerns         Walk 150 feet activity   Assist Walk 150 feet activity did not occur: Safety/medical concerns         Walk 10 feet on uneven surface  activity   Assist Walk 10 feet on uneven  surfaces activity did not occur: Refused(Per report )         Wheelchair     Assist Will patient use wheelchair at discharge?: Yes Type of Wheelchair: Manual Wheelchair  activity did not occur: Refused         Wheelchair 50 feet with 2 turns activity    Assist    Wheelchair 50 feet with 2 turns activity did not occur: Refused       Wheelchair 150 feet activity     Assist  Wheelchair 150 feet activity did not occur: Refused       Blood pressure (!) 118/97, pulse 94, temperature 99.1 F (37.3 C), resp. rate 20, height 5\' 9"  (1.753 m), weight 96.9 kg, SpO2 96 %.  Medical Problem List and Plan: 1.Decreased functional mobilitysecondary to SCI/hangman's fractureT1 fracture dislocation T3 compression deformity/status post C6-T4 pedicle screw fixation 07/17/2019.CTOcollar Patient maynot shower- has to keep Cervical collar and thoracic extension in place at all times, reminded patient.  Continue CIR  2. Antithrombotics: -DVT/anticoagulation:Lovenox  Dopplers pending -antiplatelet therapy: N/A 3. Pain Management:Oxycodone  Changed oxy to 5-10 mg q3 hours prn since pt was supposedly confused today  Pain appears to be predominantly muscular-Robaxin scheduled on 3/28  3/29- changed meds to 5-15 mg q3 hours prn- will see if can tolerate due to sedation/confusion over weekend- also will suggest TrP injections tomorrow 4. Mood:Provide emotional support -antipsychotic agents: N/A 5. Neuropsych: This patientiscapable of making decisions on hisown behalf. 6. Skin/Wound Care:Routine skin checks 7. Fluids/Electrolytes/Nutrition:Routine in and outs 8. Hyperglycemia, likely DM. Noted history of diabetes mellitus on no medications at admission.   Hemoglobin A1c ordered for tomorrow  Metformin started on 3/26, increased on 3/28  Elevated on 3/28   CBG (last 3)  Recent Labs    07/24/19 1634 07/24/19 2058 07/25/19 0617  GLUCAP 146* 266* 182*   3/29- BGs variable, but better- con't meds and give a few days to work  9. Tobacco abuse. Counseling 10. Tachycardia- mild  11. Hypoxia-    Improving 12. Neurogenic bowel and bladder?  PVRs order x3, pending on 3/28, however appears to be voiding without difficulty  No documented bowel movement, meds increased on 3/28, continue further adjustments as necessary  13. Hyponatremia  Sodium 128 on 3/26  Labs ordered for tomorrow  3/29- Na 129- slightly improved 14.  Leukocytosis  WBCs 12.2 on 3/26  Labs ordered for tomorrpw  3/29- WBC down to 9.3k- will monitor 15.  Acute blood loss anemia  Hemoglobin 11.3 on 3/26  Labs ordered for tomorrow  3/29- Hb 11.3  LOS: 4 days A FACE TO FACE EVALUATION WAS PERFORMED  Charles Perez 07/25/2019, 10:32 AM

## 2019-07-25 NOTE — Progress Notes (Signed)
Physical Therapy Session Note  Patient Details  Name: Charles Perez MRN: [id number] Date of Birth: 27-Feb-1986  Today's Date: 07/25/2019 PT Individual Time: 0830-0900 PT Individual Time Calculation (min): 30 min   Short Term Goals: Week 1:  PT Short Term Goal 1 (Week 1): Pt will transfer to [facility] with CGA PT Short Term Goal 2 (Week 1): Pt will ambulate 76ft with supervision assist PT Short Term Goal 3 (Week 1): Pt will initiate stair training. PT Short Term Goal 4 (Week 1): pt will remain out if bed 1 hour between therapies  Skilled Therapeutic Interventions/Progress Updates: Pt presents supine in bed, c/o pain to occiput/neck, refusing therapy.  PTR spoke w/ nursing for pain meds and will return.  Pt agreeable to participate w/ therapy after 30 minutes.  Pt performed multiple rolls side to side w/ siderails and supervision, verbal cues for hooklying position for log roll.  Donned cervical-thoracic brace supine and performed sup to sit w/ min A and log roll technique.  Pt sat EOB for minor adjustments to brace positioning.  Pt required min A for sit to stand transfer and verbal cues for hand placement.  Pt amb in room x 3 trials w/ RW and min A.  Pt requires verbal cues for maintaining hand position on RW as continues to reach for brace during gait.  Pt sat in recliner between trials but states unable to remain sitting at this time even w/ positioning.  Pt amb to bed and min A for safe transition stand to sit.  Pt performed log roll w/ min A able to bring LEs onto bed.  Pt rolled w/ supervision to doff brace.  Pt left supine in bed w/ needs in place and bed alarm on.  Interpreter present throughout session.     Therapy Documentation Precautions:  Precautions Precautions: Fall, Cervical, Back Precaution Comments: brace must be donned in supine Required Braces or Orthoses: Cervical Brace, Spinal Brace Cervical Brace: Hard collar, At all times Spinal Brace: Other (comment) Spinal Brace Comments:  Brace can be removed in bed Restrictions Weight Bearing Restrictions: Yes General: PT Amount of Missed Time (min): 30 Minutes PT Missed Treatment Reason: Pain(Pt given pain meds and requested to return.) Vital Signs:  Pain: c/o 10/10 pain in posterior head/neck , given pain meds and decreased, but unable to state. Pain Assessment Pain Scale: 0-10 Pain Score: 10-Worst pain ever Pain Type: Acute pain Pain Location: Neck Pain Orientation: Posterior Pain Descriptors / Indicators: Sharp Pain Frequency: Intermittent Pain Onset: On-going Patients Stated Pain Goal: 2 Pain Intervention(s): Medication (See eMAR) Mobility:   Locomotion :    Trunk/Postural Assessment :    Balance:   Exercises:   Other Treatments:      Therapy/Group: Individual Therapy  Lucio Edward 07/25/2019, 9:26 AM

## 2019-07-25 NOTE — Progress Notes (Addendum)
Physical Therapy Session Note  Patient Details  Name: Charles Perez MRN: [id number] Date of Birth: 1985-09-28  Today's Date: 07/25/2019 PT Individual Time: 1415-1440 PT Individual Time Calculation (min): 25 min  PT Missed Time: 50 min Missed Time Reason: pain  Short Term Goals: Week 1:  PT Short Term Goal 1 (Week 1): Pt will transfer to [facility] with CGA PT Short Term Goal 2 (Week 1): Pt will ambulate 62ft with supervision assist PT Short Term Goal 3 (Week 1): Pt will initiate stair training. PT Short Term Goal 4 (Week 1): pt will remain out if bed 1 hour between therapies  Skilled Therapeutic Interventions/Progress Updates:   Pt received supine in bed, agreeable with encouragement to participate in therapy session. Pt reports pain in posterior neck region at rest, not rated but reports he is taking strong painkillers for pain management. Pt is max A to don cervical brace via rolling in bed with min A. Supine to sit with min A for trunk control with increased time needed due to pain. Pt reports dizziness upon initially sitting up that resolves after seated rest break. Pt reports urge to use the bathroom. Sit to stand with min A to RW, ambulation into bathroom with RW and min A with cues for BUE on RW as pt tends to hold onto his brace with LUE. Toilet transfer with min A, assist needed for clothing management. Pt able to void urine while seated on elevated BSC over toilet but also urinates on the floor. Ambulation back to w/c with RW and min A. Pt is max A to change into clean pants and dependent to change socks. Pt then encouraged to ambulate in the hallway to therapy gym for session. Pt declines any further participation in therapy session due to ongoing pain in posterior neck. Ambulation back to bed with RW and min A. Sit to supine mod A for BLE management. Pt left semi-reclined in bed with needs in reach, bed alarm in place. Pt missed 50 min of scheduled skilled therapy session due to pain.  Interpreter present during therapy session.   Therapy Documentation Precautions:  Precautions Precautions: Fall, Cervical, Back Precaution Comments: brace must be donned in supine Required Braces or Orthoses: Cervical Brace, Spinal Brace Cervical Brace: Hard collar, At all times Spinal Brace: Other (comment) Spinal Brace Comments: Brace can be removed in bed Restrictions Weight Bearing Restrictions: Yes    Therapy/Group: Individual Therapy   Peter Congo, PT, DPT  07/25/2019, 3:15 PM

## 2019-07-25 NOTE — Plan of Care (Signed)
  Problem: Consults Goal: RH GENERAL PATIENT EDUCATION Description: See Patient Education module for education specifics. Outcome: Progressing Goal: Skin Care Protocol Initiated - if Braden Score 18 or less Description: If consults are not indicated, leave blank or document N/A Outcome: Progressing Goal: Nutrition Consult-if indicated Outcome: Progressing Goal: Diabetes Guidelines if Diabetic/Glucose > 140 Description: If diabetic or lab glucose is > 140 mg/dl - Initiate Diabetes/Hyperglycemia Guidelines & Document Interventions  Outcome: Progressing   Problem: RH BOWEL ELIMINATION Goal: RH STG MANAGE BOWEL WITH ASSISTANCE Description: STG Manage Bowel with Assistance. Outcome: Progressing Goal: RH STG MANAGE BOWEL W/MEDICATION W/ASSISTANCE Description: STG Manage Bowel with Medication with Assistance. Outcome: Progressing   Problem: RH BLADDER ELIMINATION Goal: RH STG MANAGE BLADDER WITH ASSISTANCE Description: STG Manage Bladder With Assistance Outcome: Progressing Goal: RH STG MANAGE BLADDER WITH MEDICATION WITH ASSISTANCE Description: STG Manage Bladder With Medication With Assistance. Outcome: Progressing Goal: RH STG MANAGE BLADDER WITH EQUIPMENT WITH ASSISTANCE Description: STG Manage Bladder With Equipment With Assistance Outcome: Progressing   Problem: RH SKIN INTEGRITY Goal: RH STG SKIN FREE OF INFECTION/BREAKDOWN Outcome: Progressing Goal: RH STG MAINTAIN SKIN INTEGRITY WITH ASSISTANCE Description: STG Maintain Skin Integrity With Assistance. Outcome: Progressing Goal: RH STG ABLE TO PERFORM INCISION/WOUND CARE W/ASSISTANCE Description: STG Able To Perform Incision/Wound Care With Assistance. Outcome: Progressing   Problem: RH SAFETY Goal: RH STG ADHERE TO SAFETY PRECAUTIONS W/ASSISTANCE/DEVICE Description: STG Adhere to Safety Precautions With Assistance/Device. Outcome: Progressing Goal: RH STG DECREASED RISK OF FALL WITH ASSISTANCE Description: STG  Decreased Risk of Fall With Assistance. Outcome: Progressing   Problem: RH PAIN MANAGEMENT Goal: RH STG PAIN MANAGED AT OR BELOW PT'S PAIN GOAL Outcome: Progressing   Problem: RH KNOWLEDGE DEFICIT GENERAL Goal: RH STG INCREASE KNOWLEDGE OF SELF CARE AFTER HOSPITALIZATION Outcome: Progressing   

## 2019-07-25 NOTE — Progress Notes (Signed)
Occupational Therapy Session Note  Patient Details  Name: Charles Perez MRN: [id number] Date of Birth: 11-Nov-1985  Today's Date: 07/25/2019 OT Individual Time: 1000-1100 OT Individual Time Calculation (min): 60 min    Short Term Goals: Week 1:  OT Short Term Goal 1 (Week 1): STGS equal to LTGs set at supervision to modified independent.  Skilled Therapeutic Interventions/Progress Updates:    Pt resting in bed upon arrival with interpreter present. Pt agreeable to engaging in therapy including UB bathing/dressing at bed level (supine), donning CTO, and amb with RW to w/c at sink for LB bathing/dressing tasks.  Supine>sit EOB with min A.  Pt amb with RW to w/c at sink with CGA.  Pt completed LB bathing/dressing tasks with CGA. Pt propelled w/c to tub room and practiced stepping into bath tub with CGA. Pt propelled back to room and transferred to bed. Pt directed care with donning CTO. Pt remained in bed with all needs within reach and bed alarm activated.   Therapy Documentation Precautions:  Precautions Precautions: Fall, Cervical, Back Precaution Comments: brace must be donned in supine Required Braces or Orthoses: Cervical Brace, Spinal Brace Cervical Brace: Hard collar, At all times Spinal Brace: Other (comment) Spinal Brace Comments: Brace can be removed in bed Restrictions Weight Bearing Restrictions: Yes  Pain: Pain Assessment Pain Scale: 0-10 Pain Score: 8  Pain Type: Acute pain Pain Location: Neck Pain Orientation: Posterior;Mid Pain Descriptors / Indicators: Aching Pain Frequency: Intermittent Pain Onset: On-going Patients Stated Pain Goal: 2 Pain Intervention(s): Medication (See eMAR)   Therapy/Group: Individual Therapy  Rich Brave 07/25/2019, 12:24 PM

## 2019-07-26 ENCOUNTER — Inpatient Hospital Stay (HOSPITAL_COMMUNITY): Payer: Self-pay | Admitting: Speech Pathology

## 2019-07-26 ENCOUNTER — Inpatient Hospital Stay (HOSPITAL_COMMUNITY): Payer: Self-pay | Admitting: Physical Therapy

## 2019-07-26 ENCOUNTER — Inpatient Hospital Stay (HOSPITAL_COMMUNITY): Payer: Self-pay

## 2019-07-26 LAB — GLUCOSE, CAPILLARY
Glucose-Capillary: 194 mg/dL — ABNORMAL HIGH (ref 70–99)
Glucose-Capillary: 183 mg/dL — ABNORMAL HIGH (ref 70–99)
Glucose-Capillary: 147 mg/dL — ABNORMAL HIGH (ref 70–99)

## 2019-07-26 MED ORDER — TRAZODONE HCL 50 MG PO TABS
100.0000 mg | ORAL_TABLET | Freq: Every day | ORAL | Status: DC
  Administered 2019-07-26 – 2019-07-29 (×4): 100 mg via ORAL
  Filled 2019-07-26 (×4): qty 2

## 2019-07-26 NOTE — Progress Notes (Signed)
Patient called for pain medication. Oxycodone was increased today to 5-15mg  Q3hrs. At 2117, he was given the 15mg  for a pain scale of 10/10. He also had robaxin & trazodone. He called at approximately 0005 for more pain medication. Last night, his requests for pain medication was a little more spread out with the last span at 5 hours. He does not like the kpad. His oxycodone was given along with tylenol, will continue to monitor

## 2019-07-26 NOTE — Evaluation (Signed)
Speech Language Pathology Assessment and Plan  Patient Details  Name: Charles Perez MRN: [id number] Date of Birth: August 25, 1985  SLP Diagnosis: (n/a)  Rehab Potential: Excellent ELOS: n/a for ST    Today's Date: 07/26/2019 SLP Individual Time: 6222-9798 SLP Individual Time Calculation (min): 50 min   Problem List:  Patient Active Problem List   Diagnosis Date Noted  . Quadriplegia and quadriparesis (HCC) 07/25/2019  . Slow transit constipation   . Postoperative pain   . Hyperglycemia   . Hyponatremia   . Leukocytosis   . Acute blood loss anemia   . Hangman's fracture (HCC) 07/21/2019  . Thoracic spine fracture (HCC) 07/17/2019  . Thoracic compression fracture (HCC) 07/16/2019   Past Medical History:  Past Medical History:  Diagnosis Date  . Back pain due to injury   . Diabetes [facility])    Past Surgical History:  Past Surgical History:  Procedure Laterality Date  . POSTERIOR CERVICAL FUSION/FORAMINOTOMY N/A 07/17/2019   Procedure: POSTERIOR CERVICAL-THORACIC FUSION, CERVICAL SIX-THORACIC FOUR;  Surgeon: Donalee Citrin, MD;  Location: [facility] OR;  Service: Neurosurgery;  Laterality: N/A;    Assessment / Plan / Recommendation Clinical Impression   XQJ:[id number] a 34 year old non-English-speaking male on no prescription medications with history of tobacco abuse as well as diabetes mellitus on no present medications. Per chart review lives with his girlfriendand her 38 year old son. 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 1 level home 2 steps to entry. Presented 07/16/2019 after a 16 foot wall collapsed on him while at work. He did have transient loss of consciousness. Complaints of back neck and upper back pain. Admission chemistries unremarkable except WBC 19,800, glucose 299, sodium 132. Cranial CT scan negative. CT cervical spine showed mildly displaced fracture seen involving the bilateral pedicles of C2. Moderately displaced fracture also seen involving  the left lateral mass of C2 with disruption of the left transverse foramina. Fracture also seen extending through the right transverse foramen. There was some concern for vertebral artery injury. Moderately displaced fracture seen involving the posterior spinous process of C7 with mildly displaced fracture involving distal tip of spinous process of T11. Moderately compressed T1 vertebral body fracture noted with possible posterior displacement of fracture fragment into spinal canal. CTA head and neck without vascular injury or occlusion.Hangman fracture and patient underwent posterior cervical thoracic fixation C6-T4 utilizing the globus Creo set with the cortex posterior cervical set and posterior lateral arthrodesis from C6-T4 as well as placement of lateral mass screws C6 pedicle screws at C7, T1-T2-T3 and T4 bilaterally 07/17/2019 per Dr. Wynetta Emery. CTOcollar at all times.Tolerating a regular diet. Therapy evaluations completed and patient was admitted for a comprehensive rehab program 07/21/19. ST was not initially consulted on admission, however cognitive-linguistic evaluation was requested 07/25/19 and completed 07/26/19 with results as follows:  Pt presents with cognitive-linguistic abilities WFL during both formal standardized and informal functional tasks. Pt scored WFL 27/30 on MOCA Basic. He was also able to recall functional new information, reported pain but was aware he had already had pain medicine this morning and my not be able to have any more. He demonstrated functional problem solving, attention, and safety awareness during tasks. He also appeared to have good insight into current deficits and their impact on his daily functioning. His expressive and receptive language also determine to be [name] and he was 100% intelligible during conversation. Therefore, no skilled ST services are indicated at this time.     Skilled Therapeutic Interventions          Cognitive-linguistic  evaluation was  administered and results were reviewed with patient (see above for details).    SLP Assessment  Patient does not need any further Speech [place] [facility]    Recommendations  Patient destination: Home Follow up Recommendations: None Equipment Recommended: None recommended by SLP         SLP Duration     n/a for ST          Pain Pain Assessment Pain Scale: 0-10 Pain Score: 4  Pain Type: Acute pain Pain Location: Back Pain Orientation: Posterior Pain Descriptors / Indicators: Aching Pain Onset: On-going Pain Intervention(s): Other (Comment)(premedicated) Multiple Pain Sites: No      SLP Evaluation Cognition Overall Cognitive Status: Within Functional Limits for tasks assessed Arousal/Alertness: Awake/alert Orientation Level: Oriented to person;Oriented to place;Oriented to situation Attention: Sustained;Selective Sustained Attention: Appears intact Selective Attention: Appears intact Selective Attention Impairment: Verbal basic;Functional basic Memory: Appears intact Awareness: Appears intact Problem Solving: Appears intact Problem Solving Impairment: Verbal basic;Functional basic Executive Function: Self Correcting;Organizing;Initiating;Sequencing Sequencing: Appears intact Organizing: Appears intact Initiating: Appears intact Self Correcting: Appears intact Safety/Judgment: Appears intact  Comprehension Auditory Comprehension Overall Auditory Comprehension: Appears within functional limits for tasks assessed Yes/No Questions: Within Functional Limits Commands: Within Functional Limits Conversation: Complex Expression Expression Primary Mode of Expression: Verbal Verbal Expression Overall Verbal Expression: Appears within functional limits for tasks assessed Initiation: No impairment Level of Generative/Spontaneous Verbalization: Financial controller Expression Written Expression: Not tested Oral Motor Oral Motor/Sensory  Function Overall Oral Motor/Sensory Function: Within functional limits Motor Speech Overall Motor Speech: Appears within functional limits for tasks assessed Respiration: Within functional limits Phonation: Normal Resonance: Within functional limits Articulation: Within functional limitis Intelligibility: Intelligible Motor Planning: Witnin functional limits Motor Speech Errors: Not applicable    Intelligibility: Intelligible      Short Term Goals: No short term goals set   Recommendations for other services: None     The above assessment, treatment plan, treatment alternatives and goals were discussed and mutually agreed upon: by patient  Arbutus Leas 07/26/2019, 12:28 PM

## 2019-07-26 NOTE — Progress Notes (Signed)
Late entry Patient arrived to unit 07/21/19 via bed accompanied by staff Patient with no acute distress noted, safety precautions in place, call bell within reach and patient educated on calling for help. Plan of care followed and will continue to monitor.

## 2019-07-26 NOTE — Progress Notes (Signed)
Patient did not call for pain medication until almost 0600 & was given the oxycodone with tylenol. Will pass on to oncoming nurse.

## 2019-07-26 NOTE — Progress Notes (Signed)
[facility] PHYSICAL MEDICINE & REHABILITATION PROGRESS NOTE   Subjective/Complaints:  Pt reports still having pain in neck- explained he has a fracture and had surgery- will have pain- however asked if he noticed a difference in increase in pain meds yesterday- he noted he did- per nursing, asking frequently for pain meds- the longest in between is ~ 5 hrs. Things are better per pt.   Also slept a little better per pt- said "a little better" not "a lot" LBM yesterday.   ROS:   Pt denies SOB, abd pain, CP, N/V/C/D, and vision changes  Objective:   VAS [place] LOWER EXTREMITY VENOUS (DVT)  Result Date: 07/24/2019  Lower Venous DVTStudy Indications: Trauma/Rehab.  Comparison Study: No prior study on file Performing Technologist: Sherren Kerns RVS  Examination Guidelines: A complete evaluation includes B-mode imaging, spectral Doppler, color Doppler, and power Doppler as needed of all accessible portions of each vessel. Bilateral testing is considered an integral part of a complete examination. Limited examinations for reoccurring indications may be performed as noted. The reflux portion of the exam is performed with the patient in reverse Trendelenburg.  +---------+---------------+---------+-----------+----------+--------------+ RIGHT    CompressibilityPhasicitySpontaneityPropertiesThrombus Aging +---------+---------------+---------+-----------+----------+--------------+ CFV      Full           Yes      Yes                                 +---------+---------------+---------+-----------+----------+--------------+ SFJ      Full                                                        +---------+---------------+---------+-----------+----------+--------------+ FV Prox  Full                                                        +---------+---------------+---------+-----------+----------+--------------+ FV Mid   Full                                                         +---------+---------------+---------+-----------+----------+--------------+ FV DistalFull                                                        +---------+---------------+---------+-----------+----------+--------------+ PFV      Full                                                        +---------+---------------+---------+-----------+----------+--------------+ POP      Full           Yes      Yes                                 +---------+---------------+---------+-----------+----------+--------------+  PTV      Full                                                        +---------+---------------+---------+-----------+----------+--------------+ PERO     Full                                                        +---------+---------------+---------+-----------+----------+--------------+   +---------+---------------+---------+-----------+----------+--------------+ LEFT     CompressibilityPhasicitySpontaneityPropertiesThrombus Aging +---------+---------------+---------+-----------+----------+--------------+ CFV      Full           Yes      Yes                                 +---------+---------------+---------+-----------+----------+--------------+ SFJ      Full                                                        +---------+---------------+---------+-----------+----------+--------------+ FV Prox  Full                                                        +---------+---------------+---------+-----------+----------+--------------+ FV Mid   Full                                                        +---------+---------------+---------+-----------+----------+--------------+ FV DistalFull                                                        +---------+---------------+---------+-----------+----------+--------------+ PFV      Full                                                         +---------+---------------+---------+-----------+----------+--------------+ POP      Full           Yes      Yes                                 +---------+---------------+---------+-----------+----------+--------------+ PTV      Full                                                        +---------+---------------+---------+-----------+----------+--------------+  PERO     Full                                                        +---------+---------------+---------+-----------+----------+--------------+     Summary: BILATERAL: - No evidence of deep vein thrombosis seen in the lower extremities, bilaterally.   *See table(s) above for measurements and observations. Electronically signed by Waverly Ferrari MD on 07/24/2019 at 1:46:06 PM.    Final    Recent Labs    07/25/19 0726  WBC 9.3  HGB 11.3*  HCT 32.8*  PLT 509*   Recent Labs    07/25/19 0726  NA 129*  K 3.8  CL 89*  CO2 28  GLUCOSE 234*  BUN 7  CREATININE 0.67  CALCIUM 8.8*    Intake/Output Summary (Last 24 hours) at 07/26/2019 0854 Last data filed at 07/25/2019 1700 Gross per 24 hour  Intake 720 ml  Output 400 ml  Net 320 ml     Physical Exam: Vital Signs Blood pressure 123/84, pulse 95, temperature 98.3 F (36.8 C), temperature source Oral, resp. rate 19, height 5\' 9"  (1.753 m), weight 96.9 kg, SpO2 100 %. , sleepy when woke up; only speak when asked a question, used interpretor, NAD CV: RRR- no JVD HEENT: collar off since supine- incision has honeycomb dressing- dried blood on dressing Pulm: CTA B/L-  GI: soft, NT, ND; (+)BS Skin: Warm and dry.  Intact. Psych: Normal mood.  Normal behavior. Musc: TTP over posterior neck muscles Neuro: alert, but vague; doesn't answer without a lot of questions Bilateral upper extremities: 4+/5 proximal distal, unchanged Bilateral lower extremities: 4+/5 proximal distal, unchanged  Assessment/Plan: 1. Functional deficits secondary to Hangman's  fracture and resulting SCI  which require 3+ hours per day of interdisciplinary therapy in a comprehensive inpatient rehab setting.  Physiatrist is providing close team supervision and 24 hour management of active medical problems listed below.  Physiatrist and rehab team continue to assess barriers to discharge/monitor patient progress toward functional and medical goals  Care Tool:  Bathing    Body parts bathed by patient: Right arm, Left arm, Chest, Abdomen, Front perineal area, Right upper leg, Left upper leg, Right lower leg, Left lower leg, Face, Buttocks   Body parts bathed by helper: Right lower leg, Left lower leg, Buttocks     Bathing assist Assist Level: Contact Guard/Touching assist     Upper Body Dressing/Undressing Upper body dressing Upper body dressing/undressing activity did not occur (including orthotics): N/A What is the patient wearing?: Pull over shirt, Orthosis    Upper body assist Assist Level: Moderate Assistance - Patient 50 - 74%    Lower Body Dressing/Undressing Lower body dressing      What is the patient wearing?: Pants     Lower body assist Assist for lower body dressing: Contact Guard/Touching assist     Toileting Toileting    Toileting assist Assist for toileting: Minimal Assistance - Patient > 75%(urinal)     Transfers Chair/bed transfer  Transfers assist     Chair/bed transfer assist level: Minimal Assistance - Patient > 75%     Locomotion Ambulation   Ambulation assist      Assist level: Minimal Assistance - Patient > 75% Assistive device: Walker-rolling Max distance: 20'   Walk 10 feet activity   Assist  Assist level: Minimal Assistance - Patient > 75% Assistive device: Walker-rolling   Walk 50 feet activity   Assist Walk 50 feet with 2 turns activity did not occur: Safety/medical concerns         Walk 150 feet activity   Assist Walk 150 feet activity did not occur: Safety/medical concerns          Walk 10 feet on uneven surface  activity   Assist Walk 10 feet on uneven surfaces activity did not occur: Refused(Per report )         Wheelchair     Assist Will patient use wheelchair at discharge?: Yes Type of Wheelchair: Manual Wheelchair activity did not occur: Refused         Wheelchair 50 feet with 2 turns activity    Assist    Wheelchair 50 feet with 2 turns activity did not occur: Refused       Wheelchair 150 feet activity     Assist  Wheelchair 150 feet activity did not occur: Refused       Blood pressure 123/84, pulse 95, temperature 98.3 F (36.8 C), temperature source Oral, resp. rate 19, height 5\' 9"  (1.753 m), weight 96.9 kg, SpO2 100 %.  Medical Problem List and Plan: 1.Decreased functional mobilitysecondary to SCI/hangman's fractureT1 fracture dislocation T3 compression deformity/status post C6-T4 pedicle screw fixation 07/17/2019.CTOcollar Patient maynot shower- has to keep Cervical collar and thoracic extension in place at all times, reminded patient. `` 3/30- will order honeycomb dressing to be removed- dry dressing  Continue CIR  2. Antithrombotics: -DVT/anticoagulation:Lovenox  Dopplers pending -antiplatelet therapy: N/A 3. Pain Management:Oxycodone  Changed oxy to 5-10 mg q3 hours prn since pt was supposedly confused today  Pain appears to be predominantly muscular-Robaxin scheduled on 3/28  3/29- changed meds to 5-15 mg q3 hours prn- will see if can tolerate due to sedation/confusion over weekend- also will suggest TrP injections tomorrow  3/30- pt reports pain a little better 4. Mood:Provide emotional support -antipsychotic agents: N/A 5. Neuropsych: This patientiscapable of making decisions on hisown behalf. 6. Skin/Wound Care:Routine skin checks 7. Fluids/Electrolytes/Nutrition:Routine in and outs 8. Hyperglycemia, likely DM. Noted history of diabetes mellitus on  no medications at admission.   Hemoglobin A1c ordered for tomorrow  Metformin started on 3/26, increased on 3/28  Elevated on 3/28   CBG (last 3)  Recent Labs    07/25/19 1140 07/25/19 1701 07/25/19 2100  GLUCAP 216* 181* 147*   3/30- doing a little better- since on metformin- will con't regimen  9. Tobacco abuse. Counseling 10. Tachycardia- mild  11. Hypoxia-   Improving 12. Neurogenic bowel and bladder?  PVRs order x3, pending on 3/28, however appears to be voiding without difficulty  No documented bowel movement, meds increased on 3/28, continue further adjustments as necessary  13. Hyponatremia  Sodium 128 on 3/26  Labs ordered for tomorrow  3/29- Na 129- slightly improved 14.  Leukocytosis  WBCs 12.2 on 3/26  Labs ordered for tomorrpw  3/29- WBC down to 9.3k- will monitor 15.  Acute blood loss anemia  Hemoglobin 11.3 on 3/26  Labs ordered for tomorrow  3/29- Hb 11.3 16. Insomnia  3/30- will increase trazodone  LOS: 5 days A FACE TO FACE EVALUATION WAS PERFORMED  Afsheen Antony 07/26/2019, 8:54 AM

## 2019-07-26 NOTE — Progress Notes (Signed)
Occupational Therapy Session Note  Patient Details  Name: Charles Perez MRN: [id number] Date of Birth: January 02, 1986  Today's Date: 07/26/2019 OT Individual Time: 1000-1055 OT Individual Time Calculation (min): 55 min    Short Term Goals: Week 1:  OT Short Term Goal 1 (Week 1): STGS equal to LTGs set at supervision to modified independent.  Skilled Therapeutic Interventions/Progress Updates:    Pt resting in bed upon arrival with interpreter present.  Pt declined bathing and dressing tasks this morning but requested to use bathroom.  Pt initiated sitting EOB without CTO and therapist reinforced that CTO must be on at all times when not laying in bed. Pt required tot A for donning CTO. Supine>sit EOB with supervision. Pt amb with RW to bathroom with CGA and completed toileting with supervision. Pt amb back to sink and stood to wash hands and brush teeth.  Pt amb with RW to ADL apartment and practiced bed mobility on standard bed-supervision. Pt engaged in functional amb in ADL apartment and navigating in cluttered envirionment with supervision.  Pt c/o increased discomfort wearing CTO and returned to room.  Sit>supine with supervision and rolling in bed with supervision to remove CTO. Pt remained in bed with all needs within reach and bed alarm activated.   Therapy Documentation Precautions:  Precautions Precautions: Fall, Cervical, Back Precaution Comments: brace must be donned in supine Required Braces or Orthoses: Cervical Brace, Spinal Brace Cervical Brace: Hard collar, At all times Spinal Brace: Other (comment) Spinal Brace Comments: Brace can be removed in bed Restrictions Weight Bearing Restrictions: Yes Pain: Pt c/o 4/10 pain at beginning of session, 9/10 pain after amb with RW, and 7/10 pain at end of session supine in bed; repositioned, RN aware Therapy/Group: Individual Therapy  Rich Brave 07/26/2019, 12:31 PM

## 2019-07-26 NOTE — Patient Care Conference (Signed)
Inpatient RehabilitationTeam Conference and Plan of Care Update Date: 07/26/2019   Time: 11:25 AM    Patient Name: Charles Perez      Medical Record Number: [id number]  Date of Birth: [birth year] Sex: Male         Room/Bed: 4M07C/4M07C-01 Payor Info: Payor: MEDICAID POTENTIAL / Plan: MEDICAID POTENTIAL / Product Type: *No Product type* /    Admit Date/Time:  07/21/2019  6:03 PM  Primary Diagnosis:  Quadriplegia and quadriparesis (HCC)  Patient Active Problem List   Diagnosis Date Noted  . Quadriplegia and quadriparesis (HCC) 07/25/2019  . Slow transit constipation   . Postoperative pain   . Hyperglycemia   . Hyponatremia   . Leukocytosis   . Acute blood loss anemia   . Hangman's fracture (HCC) 07/21/2019  . Thoracic spine fracture (HCC) 07/17/2019  . Thoracic compression fracture (HCC) 07/16/2019    Expected Discharge Date: Expected Discharge Date: 08/01/19  Team Members Present: Physician leading conference: Dr. Genice Rouge Care Coodinator Present: Cecile Sheerer, LCSWA;Genie Tirzah Fross, RN, MSN Nurse Present: Other (comment)(Kristy Ladona Ridgel, RN) PT Present: Peter Congo, PT OT Present: Roney Mans, OT;Ardis Rowan, COTA SLP Present: Suzzette Righter, CF-SLP PPS Coordinator present : Fae Pippin, SLP     Current Status/Progress Goal Weekly Team Focus  Bowel/Bladder   continent of bowel & bladder, LBM 3/27 after multiple bowel meds  remain continent & normal bowel pattern  monitor   Swallow/Nutrition/ Hydration             ADL's   max A donning CTO; UB bathing/dressing at bed level-min A; LB bathing/dressing at sink-min A; functional transfers-CGA  supervision overall; mod A UB dressing for donning CTO  BADL retraining, funcitonal amb/transfers, safety awareness, activity tolerance, education   Mobility   min A bed mobility, min A transfers and gait up to 60 ft with RW  Supervision overall  pain management, transfers, gait, safety awareness   Communication              Safety/Cognition/ Behavioral Observations  WFL on eval during formal and informal tasks, appreciate consult but no ST indicated at this time         Pain   c/o pain to posterior neck 10/10, has new order for robaxin scheduled, new oxy 5-10mg  Q3 hrs & tylenol prn  pain scale <6/10  assess & treat as needed   Skin   honeycomb dressing to incision to the posterior neck  no new areas of skin break down or infection to incision site  assess q shift    Rehab Goals Patient on target to meet rehab goals: Yes *See Care Plan and progress notes for long and short-term goals.     Barriers to Discharge  Current Status/Progress Possible Resolutions Date Resolved   Nursing                  PT  Inaccessible home environment;Medical stability;Home environment access/layout;Other (comments)  pain              OT                  SLP                SW Inaccessible home environment;Decreased caregiver support;Medical stability;Home environment access/layout;Lack of/limited family support;Other (comments) Pt is uninsured and will have difficulty with getting HH/OP or DME needed            Discharge Planning/Teaching Needs:  Pt plans to discharge home with significant  other and her son to assit with care      Team Discussion: MD pain, increased pain meds yesterday, int confusion, meds adjusted, change drsg today.  RN pain, med given for pain, cont B/B.  PT CGA/min bed and transfers, amb 60' CGA.  OT S/mod I goals, mod A goal to apply brace, ADLs B/D bed level, amb to ADL apartment, has interpreter.  SLP eval done, good insight, engaged, no need to follow.   Revisions to Treatment Plan: N/A     Medical Summary Current Status: asking pain meds frequently- doing better today; c/o pressure from brace; constant pain complaints Weekly Focus/Goal: CGA- min assist bed; walked 60 ft RW CGA;  Barriers to Discharge: Behavior;Weight bearing restrictions;Weight;Home enviroment access/layout;Wound care;New  diabetic  Barriers to Discharge Comments: did OK with SLP- not covered by them anymore Possible Resolutions to Barriers: OT- UB bedlevel, LB  can maintain precautions; brace limits what can do; fine without bed rails; can use wall to get into tub;   Continued Need for Acute Rehabilitation Level of Care: The patient requires daily medical management by a physician with specialized training in physical medicine and rehabilitation for the following reasons: Direction of a multidisciplinary physical rehabilitation program to maximize functional independence : Yes Medical management of patient stability for increased activity during participation in an intensive rehabilitation regime.: Yes Analysis of laboratory values and/or radiology reports with any subsequent need for medication adjustment and/or medical intervention. : Yes   I attest that I was present, lead the team conference, and concur with the assessment and plan of the team.   Retta Diones 07/26/2019, 7:43 PM   Team conference was held via web/ teleconference due to [place] - 19

## 2019-07-26 NOTE — Progress Notes (Signed)
Physical Therapy Session Note  Patient Details  Name: Charles Perez MRN: [id number] Date of Birth: 1985/06/04  Today's Date: 07/26/2019 PT Individual Time: 1415-1445 PT Individual Time Calculation (min): 30 min  PT Missed Time: 45 min Missed Time Reason: pain  Short Term Goals: Week 1:  PT Short Term Goal 1 (Week 1): Pt will transfer to [facility] with CGA PT Short Term Goal 2 (Week 1): Pt will ambulate 34ft with supervision assist PT Short Term Goal 3 (Week 1): Pt will initiate stair training. PT Short Term Goal 4 (Week 1): pt will remain out if bed 1 hour between therapies  Skilled Therapeutic Interventions/Progress Updates:    Pt received semi-reclined in bed, agreeable to PT session. Pt reports 8/10 pain in neck at rest but reports being premedicated prior to start of therapy session. Pt is max A to don cervical brace via rolling with CGA. Supine to sit with CGA. Once seated EOB pt reports increase in pain in posterior neck region with onset of spasms. Sit to stand with CGA to RW, Ambulation x 10 ft to recliner with RW and CGA. Once seated in recliner placed pillow behind pt's back for improved comfort. Pt has no relief of pain or spasms seated with back supported, requests to return to bed. Sit to stand with CGA, ambulation with RW and CGA. Sit to supine CGA with use of bedrail and HOB slightly elevated. Rolling L/R with CGA in order to doff brace. Discussed benefits of heat and/or ice for pain management, pt requests ice pack. Provided pt with ice pack for posterior neck for pain management and notified RN of ongoing pain and spasms. Pt left semi-reclined in bed with needs in reach, bed alarm in place at end of session. Pt missed 45 min of scheduled therapy session due to pain.  Therapy Documentation Precautions:  Precautions Precautions: Fall, Cervical, Back Precaution Comments: brace must be donned in supine Required Braces or Orthoses: Cervical Brace, Spinal Brace Cervical Brace: Hard  collar, At all times Spinal Brace: Other (comment) Spinal Brace Comments: Brace can be removed in bed Restrictions Weight Bearing Restrictions: Yes    Therapy/Group: Individual Therapy   Peter Congo, PT, DPT  07/26/2019, 3:53 PM

## 2019-07-26 NOTE — Progress Notes (Addendum)
Social Work Patient ID: Charles Perez, male   DOB: 1985/09/01, 34 y.o.   MRN: [id number]     SW used interpretor Shari Prows #[id number]) to update patient of team conference. Reported he is expected to discharge home supervision MOD I. Informed pt tat team aggred on discharge date of 07/30/19.  Pt is uninsured sw informed pt that resources may be limited due this reason. Sw will continue to update patient of what's referred. SW ordered pt a Conservation officer, historic buildings.

## 2019-07-27 ENCOUNTER — Inpatient Hospital Stay (HOSPITAL_COMMUNITY): Payer: Self-pay

## 2019-07-27 ENCOUNTER — Inpatient Hospital Stay (HOSPITAL_COMMUNITY): Payer: Self-pay | Admitting: Physical Therapy

## 2019-07-27 LAB — GLUCOSE, CAPILLARY
Glucose-Capillary: 166 mg/dL — ABNORMAL HIGH (ref 70–99)
Glucose-Capillary: 169 mg/dL — ABNORMAL HIGH (ref 70–99)
Glucose-Capillary: 305 mg/dL — ABNORMAL HIGH (ref 70–99)
Glucose-Capillary: 154 mg/dL — ABNORMAL HIGH (ref 70–99)
Glucose-Capillary: 135 mg/dL — ABNORMAL HIGH (ref 70–99)

## 2019-07-27 MED ORDER — GLIPIZIDE 5 MG PO TABS
2.5000 mg | ORAL_TABLET | Freq: Every day | ORAL | Status: DC
  Administered 2019-07-28 – 2019-07-30 (×3): 2.5 mg via ORAL
  Filled 2019-07-27: qty 0.5
  Filled 2019-07-27: qty 1
  Filled 2019-07-27 (×2): qty 0.5

## 2019-07-27 NOTE — Progress Notes (Signed)
Occupational Therapy Session Note  Patient Details  Name: Charles Perez MRN: [id number] Date of Birth: 1985-08-17  Today's Date: 07/27/2019 OT Individual Time: 9407-6808 OT Individual Time Calculation (min): 15 min  and Today's Date: 07/27/2019 OT Missed Time: 45 Minutes Missed Time Reason: Pain   Short Term Goals: [name]: STG=LTG secondary to ELOS  Skilled Therapeutic Interventions/Progress Updates:    Pt resting in bed upon arrival with interpreter present.  Pt c/o 1010 pain in shoulders and neck.  On questioning, pt stated MD discussed trigger point injections and pt agreed.  Pt commented that he was currently in too much pain to actively participate.  Discussed therapy schedule and importance of partcipating in all therapies.  Pt agreed to participate in remaining two therapy sessions.  Pt remained in bed with all needs within reach and bed alarm activated.   Therapy Documentation Precautions:  Precautions Precautions: Fall, Cervical, Back Precaution Comments: brace must be donned in supine Required Braces or Orthoses: Cervical Brace, Spinal Brace Cervical Brace: Hard collar, At all times Spinal Brace: Other (comment) Spinal Brace Comments: Brace can be removed in bed Restrictions Weight Bearing Restrictions: Yes General: General OT Amount of Missed Time: 45 Minutes   Pain: Pt c/o 10/10 pain in shoulders and back of neck; RN and MD aware, repositioned  Therapy/Group: Individual Therapy  Rich Brave 07/27/2019, 9:01 AM

## 2019-07-27 NOTE — Progress Notes (Signed)
[place] PHYSICAL MEDICINE & REHABILITATION PROGRESS NOTE   Subjective/Complaints:  Pt reports neck is so tight, painful- per PT discussed doing TrP injections with pt yesterday- this AM, said he didn't know what I was talking about- explained injections- explained how can loosen tight muscles and reduce pain- he sounds agreeable for tomorrow.   Had LBM yesterday- voiding well.  ROS:   Pt denies SOB, abd pain, CP, N/V/C/D, and vision changes   Objective:   No results found. Recent Labs    07/25/19 0726  WBC 9.3  HGB 11.3*  HCT 32.8*  PLT 509*   Recent Labs    07/25/19 0726  NA 129*  K 3.8  CL 89*  CO2 28  GLUCOSE 234*  BUN 7  CREATININE 0.67  CALCIUM 8.8*    Intake/Output Summary (Last 24 hours) at 07/27/2019 0914 Last data filed at 07/27/2019 0533 Gross per 24 hour  Intake 240 ml  Output 2425 ml  Net -2185 ml     Physical Exam: Vital Signs Blood pressure 121/82, pulse 92, temperature 98.2 F (36.8 C), resp. rate 18, height 5\' 9"  (1.753 m), weight 96.9 kg, SpO2 98 %. Gen: awake, supine in bed; seen with interpretor, NAD HEENT:collar off since supine Scalenes, levators, rhomboids and upper traps tight- very TTP CV: RRR Pulm: CTA B/L- good air movement GI_ sift, NT, ND, (+)BS Skin: Warm and dry.  Intact. Psych: standoffish Musc: TTP over neck muscles- as above Neuro: alert, but vague; doesn't answer without a lot of questions Bilateral upper extremities: 4+/5 proximal distal, unchanged Bilateral lower extremities: 4+/5 proximal distal, unchanged  Assessment/Plan: 1. Functional deficits secondary to Hangman's fracture and resulting SCI  which require 3+ hours per day of interdisciplinary therapy in a comprehensive inpatient rehab setting.  Physiatrist is providing close team supervision and 24 hour management of active medical problems listed below.  Physiatrist and rehab team continue to assess barriers to discharge/monitor patient progress toward  functional and medical goals  Care Tool:  Bathing    Body parts bathed by patient: Right arm, Left arm, Chest, Abdomen, Front perineal area, Right upper leg, Left upper leg, Right lower leg, Left lower leg, Face, Buttocks   Body parts bathed by helper: Right lower leg, Left lower leg, Buttocks     Bathing assist Assist Level: Contact Guard/Touching assist     Upper Body Dressing/Undressing Upper body dressing Upper body dressing/undressing activity did not occur (including orthotics): N/A What is the patient wearing?: Pull over shirt, Orthosis    Upper body assist Assist Level: Moderate Assistance - Patient 50 - 74%    Lower Body Dressing/Undressing Lower body dressing      What is the patient wearing?: Pants     Lower body assist Assist for lower body dressing: Contact Guard/Touching assist     Toileting Toileting    Toileting assist Assist for toileting: Minimal Assistance - Patient > 75%(urinal)     Transfers Chair/bed transfer  Transfers assist     Chair/bed transfer assist level: Minimal Assistance - Patient > 75%     Locomotion Ambulation   Ambulation assist      Assist level: Minimal Assistance - Patient > 75% Assistive device: Walker-rolling Max distance: 20'   Walk 10 feet activity   Assist     Assist level: Minimal Assistance - Patient > 75% Assistive device: Walker-rolling   Walk 50 feet activity   Assist Walk 50 feet with 2 turns activity did not occur: Safety/medical concerns  Walk 150 feet activity   Assist Walk 150 feet activity did not occur: Safety/medical concerns         Walk 10 feet on uneven surface  activity   Assist Walk 10 feet on uneven surfaces activity did not occur: Refused(Per report )         Wheelchair     Assist Will patient use wheelchair at discharge?: Yes Type of Wheelchair: Manual Wheelchair activity did not occur: Refused         Wheelchair 50 feet with 2 turns  activity    Assist    Wheelchair 50 feet with 2 turns activity did not occur: Refused       Wheelchair 150 feet activity     Assist  Wheelchair 150 feet activity did not occur: Refused       Blood pressure 121/82, pulse 92, temperature 98.2 F (36.8 C), resp. rate 18, height 5\' 9"  (1.753 m), weight 96.9 kg, SpO2 98 %.  Medical Problem List and Plan: 1.Decreased functional mobilitysecondary to SCI/hangman's fractureT1 fracture dislocation T3 compression deformity/status post C6-T4 pedicle screw fixation 07/17/2019.CTOcollar Patient maynot shower- has to keep Cervical collar and thoracic extension in place at all times, reminded patient. `` 3/30- will order honeycomb dressing to be removed- dry dressing  Continue CIR  2. Antithrombotics: -DVT/anticoagulation:Lovenox  Dopplers pending  3/31- Doppler's (-) -antiplatelet therapy: N/A 3. Pain Management:Oxycodone  Changed oxy to 5-10 mg q3 hours prn since pt was supposedly confused today  Pain appears to be predominantly muscular-Robaxin scheduled on 3/28  3/29- changed meds to 5-15 mg q3 hours prn- will see if can tolerate due to sedation/confusion over weekend- also will suggest TrP injections tomorrow  3/30- pt reports pain a little better  3/31- will do trigger point injections tomorrow 4. Mood:Provide emotional support -antipsychotic agents: N/A 5. Neuropsych: This patientiscapable of making decisions on hisown behalf. 6. Skin/Wound Care:Routine skin checks 7. Fluids/Electrolytes/Nutrition:Routine in and outs 8. Hyperglycemia, likely DM. Noted history of diabetes mellitus on no medications at admission.   Hemoglobin A1c ordered for tomorrow  Metformin started on 3/26, increased on 3/28  Elevated on 3/28   CBG (last 3)  Recent Labs    07/26/19 1148 07/26/19 1642 07/26/19 2126  GLUCAP 194* 183* 147*   3/31- BGs overall doing well- con't metformin- A1c 9.3-  Metformin 1000 mg BID- will add Glipizide 2.5 mg daily and monitor 9. Tobacco abuse. Counseling 10. Tachycardia- mild  11. Hypoxia-   Improving 12. Neurogenic bowel and bladder?  PVRs order x3, pending on 3/28, however appears to be voiding without difficulty  No documented bowel movement, meds increased on 3/28, continue further adjustments as necessary  13. Hyponatremia  Sodium 128 on 3/26  Labs ordered for tomorrow  3/29- Na 129- slightly improved 14.  Leukocytosis  WBCs 12.2 on 3/26  Labs ordered for tomorrpw  3/29- WBC down to 9.3k- will monitor 15.  Acute blood loss anemia  Hemoglobin 11.3 on 3/26  Labs ordered for tomorrow  3/29- Hb 11.3 16. Insomnia  3/30- will increase trazodone  3/31- slept a little better- pain still an issue  LOS: 6 days A FACE TO FACE EVALUATION WAS PERFORMED  Kamia Insalaco 07/27/2019, 9:14 AM

## 2019-07-27 NOTE — Progress Notes (Signed)
Occupational Therapy Session Note  Patient Details  Name: Charles Perez MRN: [id number] Date of Birth: [birth year]  Today's Date: 07/27/2019 OT Individual Time: 1000-1055 OT Individual Time Calculation (min): 55 min    Short Term Goals: Week 1:  OT Short Term Goal 1 (Week 1): STGS equal to LTGs set at supervision to modified independent.  Skilled Therapeutic Interventions/Progress Updates:    Pt resting in bed upon arrival with interpreter present.  Pt agreeable to participating in therapy (see note belo regarding pain). Pt requested use of bathroom.  Rolling in bed with supervision to facilitate donning CTO. Pt amb with RW intor bathroom and stood at toilet to urinate-supervision. Pt continues to c/o discomfort when wearing brace.  Pt returned to EOB to allow adjustments. Pt returned to supine to further facilitate CTO adjustments. Pt comments that he feels like he is not getting enough support at back of head but also states when collar is tightened it is too tight under chin.  Pt agreeable to amb with RW to gym and ADL apartment.  All ambulation at supervision level with no LOB. Pt returned to room and transferred to bed.  Pt remained in bed with all needs within reach and bed alarm activated.   Therapy Documentation Precautions:  Precautions Precautions: Fall, Cervical, Back Precaution Comments: brace must be donned in supine Required Braces or Orthoses: Cervical Brace, Spinal Brace Cervical Brace: Hard collar, At all times Spinal Brace: Other (comment) Spinal Brace Comments: Brace can be removed in bed Restrictions Weight Bearing Restrictions: Yes General:   Vital Signs:   Pain: Pt c/o 7/10 pain in shoulders and back of neck; pt agreeable to try to participate in therapy  Therapy/Group: Individual Therapy  Rich Brave 07/27/2019, 1:26 PM

## 2019-07-27 NOTE — Progress Notes (Signed)
Physical Therapy Session Note  Patient Details  Name: Charles Perez MRN: [id number] Date of Birth: April 25, 1986  Today's Date: 07/27/2019 PT Individual Time: 1415-1530 PT Individual Time Calculation (min): 75 min   Short Term Goals: Week 1:  PT Short Term Goal 1 (Week 1): Pt will transfer to [facility] with CGA PT Short Term Goal 2 (Week 1): Pt will ambulate 10ft with supervision assist PT Short Term Goal 3 (Week 1): Pt will initiate stair training. PT Short Term Goal 4 (Week 1): pt will remain out if bed 1 hour between therapies  Skilled Therapeutic Interventions/Progress Updates:    Pt received seated in bed, agreeable to PT session. Minimal complaints of pain in posterior neck this session. Pt is mod A to don brace in supine via rolling at Supervision level. Supine to sit with Supervision with use of bedrail and HOB slightly elevated. Placed wash cloths in posterior portion of cervical brace for improved fit and comfort as pt has been complaining of pain from brace. Pt reports improvement with placement of washcloths with minimal complaints of pain during session. Sit to stand with Supervision to RW throughout session. Ambulation up to 200 ft with RW at Supervision to CGA level. Ascend/descend 12 stairs with 2 handrails and CGA for balance, step-through gait pattern. Sit to/from supine on real bed in simulation apartment at Supervision level. Pt reports some tightness in B gastroc, demonstrated seated stretch he can perform with a sheet, recommending 3x/LE for 60 sec each. Pt demonstrates good return demo of how to perform stretch. Standing alt L/R 4" forward and lateral step-taps with no UE support and min A for balance x 15 reps each. Trial gait with no UE support and CGA x 120 ft. Pt exhibits overall decreased balance and safety with no AD use during gait, recommend continued use of RW. Toilet transfer with Supervision, pt is independent for clothing management and pericare. Pt returned to bed at end of  session, needs in reach, ice pack to posterior neck for pain management.  Therapy Documentation Precautions:  Precautions Precautions: Fall, Cervical, Back Precaution Comments: brace must be donned in supine Required Braces or Orthoses: Cervical Brace, Spinal Brace Cervical Brace: Hard collar, At all times Spinal Brace: Other (comment) Spinal Brace Comments: Brace can be removed in bed Restrictions Weight Bearing Restrictions: Yes    Therapy/Group: Individual Therapy   Peter Congo, PT, DPT  07/27/2019, 3:39 PM

## 2019-07-28 ENCOUNTER — Inpatient Hospital Stay (HOSPITAL_COMMUNITY): Payer: Self-pay

## 2019-07-28 ENCOUNTER — Inpatient Hospital Stay (HOSPITAL_COMMUNITY): Payer: Self-pay | Admitting: Physical Therapy

## 2019-07-28 DIAGNOSIS — G8250 Quadriplegia, unspecified: Secondary | ICD-10-CM

## 2019-07-28 LAB — GLUCOSE, CAPILLARY
Glucose-Capillary: 167 mg/dL — ABNORMAL HIGH (ref 70–99)
Glucose-Capillary: 190 mg/dL — ABNORMAL HIGH (ref 70–99)
Glucose-Capillary: 169 mg/dL — ABNORMAL HIGH (ref 70–99)
Glucose-Capillary: 181 mg/dL — ABNORMAL HIGH (ref 70–99)

## 2019-07-28 MED ORDER — LIDOCAINE HCL 1 % IJ SOLN
5.0000 mL | Freq: Once | INTRAMUSCULAR | Status: AC
  Administered 2019-07-28: 4 mL
  Filled 2019-07-28 (×2): qty 5

## 2019-07-28 MED ORDER — METFORMIN HCL 1000 MG PO TABS: 1000.0000 mg | ORAL_TABLET | Freq: Two times a day (BID) | ORAL | 0 refills | Status: DC

## 2019-07-28 MED ORDER — POLYETHYLENE GLYCOL 3350 17 G PO PACK: 17.0000 g | PACK | Freq: Two times a day (BID) | ORAL | 0 refills | Status: AC

## 2019-07-28 MED ORDER — ACETAMINOPHEN 325 MG PO TABS: 650.0000 mg | ORAL_TABLET | ORAL | Status: AC | PRN

## 2019-07-28 MED ORDER — GLIPIZIDE 5 MG PO TABS: 2.5000 mg | ORAL_TABLET | Freq: Every day | ORAL | 0 refills | Status: DC

## 2019-07-28 MED ORDER — METHOCARBAMOL 750 MG PO TABS: 750.0000 mg | ORAL_TABLET | Freq: Three times a day (TID) | ORAL | 0 refills | Status: AC

## 2019-07-28 MED ORDER — OXYCODONE HCL 5 MG PO TABS: 10.0000 mg | ORAL_TABLET | ORAL | 0 refills | Status: DC | PRN

## 2019-07-28 MED ORDER — DOCUSATE SODIUM 100 MG PO CAPS: 100.0000 mg | ORAL_CAPSULE | Freq: Two times a day (BID) | ORAL | 0 refills | Status: AC

## 2019-07-28 NOTE — Plan of Care (Signed)
  Problem: Consults Goal: RH GENERAL PATIENT EDUCATION Description: See Patient Education module for education specifics. Outcome: Progressing Goal: Skin Care Protocol Initiated - if Braden Score 18 or less Description: If consults are not indicated, leave blank or document N/A Outcome: Progressing Goal: Nutrition Consult-if indicated Outcome: Progressing Goal: Diabetes Guidelines if Diabetic/Glucose > 140 Description: If diabetic or lab glucose is > 140 mg/dl - Initiate Diabetes/Hyperglycemia Guidelines & Document Interventions  Outcome: Progressing   Problem: RH BOWEL ELIMINATION Goal: RH STG MANAGE BOWEL WITH ASSISTANCE Description: STG Manage Bowel with stand by  (minimal) Assistance. Outcome: Progressing Goal: RH STG MANAGE BOWEL W/MEDICATION W/ASSISTANCE Description: STG Manage Bowel with Medication with minimal Assistance. Outcome: Progressing   Problem: RH BLADDER ELIMINATION Goal: RH STG MANAGE BLADDER WITH ASSISTANCE Description: STG Manage Bladder With  mod I  Outcome: Progressing Goal: RH STG MANAGE BLADDER WITH MEDICATION WITH ASSISTANCE Description: STG Manage Bladder With Medication With Assistance. N/A Outcome: Progressing Goal: RH STG MANAGE BLADDER WITH EQUIPMENT WITH ASSISTANCE Description: STG Manage Bladder With Equipment With mod I Outcome: Progressing   Problem: RH SKIN INTEGRITY Goal: RH STG SKIN FREE OF INFECTION/BREAKDOWN Outcome: Progressing Goal: RH STG MAINTAIN SKIN INTEGRITY WITH ASSISTANCE Description: STG Maintain Skin Integrity With Mod I Outcome: Progressing Goal: RH STG ABLE TO PERFORM INCISION/WOUND CARE W/ASSISTANCE Description: STG Able To Perform Incision/Wound Care With minimal Assistance. Outcome: Progressing   Problem: RH SAFETY Goal: RH STG ADHERE TO SAFETY PRECAUTIONS W/ASSISTANCE/DEVICE Description: STG Adhere to Safety Precautions With minimal Assistance/Device. Outcome: Progressing Goal: RH STG DECREASED RISK OF FALL WITH  ASSISTANCE Description: STG Decreased Risk of Fall With Assistance. Outcome: Progressing   Problem: RH PAIN MANAGEMENT Goal: RH STG PAIN MANAGED AT OR BELOW PT'S PAIN GOAL Outcome: Progressing   Problem: RH KNOWLEDGE DEFICIT GENERAL Goal: RH STG INCREASE KNOWLEDGE OF SELF CARE AFTER HOSPITALIZATION Outcome: Progressing

## 2019-07-28 NOTE — Progress Notes (Addendum)
[place] PHYSICAL MEDICINE & REHABILITATION PROGRESS NOTE   Subjective/Complaints:  Pt reports pain is ~4-5/10- but can't sleep at night due to pain.  Most of pain is in neck- in the muscles.   Will do trigger points at 11am.  Head of bed >30 degrees when I came in- explained cannot keep >30 degrees without the brace.     ROS:   Pt denies SOB, abd pain, CP, N/V/C/D, and vision changes   Objective:   No results found. No results for input(s): WBC, HGB, HCT, PLT in the last 72 hours. No results for input(s): NA, K, CL, CO2, GLUCOSE, BUN, CREATININE, CALCIUM in the last 72 hours.  Intake/Output Summary (Last 24 hours) at 07/28/2019 1242 Last data filed at 07/28/2019 0720 Gross per 24 hour  Intake 720 ml  Output 1150 ml  Net -430 ml     Physical Exam: Vital Signs Blood pressure 116/77, pulse 88, temperature 98.6 F (37 C), temperature source Oral, resp. rate 18, height 5\' 9"  (1.753 m), weight 96.9 kg, SpO2 97 %. Gen: awake, alert, laying up ~ 45 degrees, no collar, NAD HEENT:not wearing collar- incision covered with steristrips AND dressing- doesn't need both Tight and TTP in B/L levators, splenius capitus and rhomboids B/L CV: RRR Pulm: CTA b?L GI- soft, NT, ND, (+)BS Skin: Warm and dry.  Intact. Psych: standoffish even with interpretor Musc: TTP over posterior muscles as above Neuro: alert, but vague; doesn't answer without a lot of questions Bilateral upper extremities: 4+/5 proximal distal, unchanged Bilateral lower extremities: 4+/5 proximal distal, unchanged  Assessment/Plan: 1. Functional deficits secondary to Hangman's fracture and resulting SCI  which require 3+ hours per day of interdisciplinary therapy in a comprehensive inpatient rehab setting.  Physiatrist is providing close team supervision and 24 hour management of active medical problems listed below.  Physiatrist and rehab team continue to assess barriers to discharge/monitor patient progress toward  functional and medical goals  Care Tool:  Bathing    Body parts bathed by patient: Right arm, Left arm, Chest, Abdomen, Front perineal area, Right upper leg, Left upper leg, Right lower leg, Left lower leg, Face, Buttocks   Body parts bathed by helper: Right lower leg, Left lower leg, Buttocks     Bathing assist Assist Level: Supervision/Verbal cueing     Upper Body Dressing/Undressing Upper body dressing Upper body dressing/undressing activity did not occur (including orthotics): N/A What is the patient wearing?: Pull over shirt, Orthosis    Upper body assist Assist Level: Moderate Assistance - Patient 50 - 74%    Lower Body Dressing/Undressing Lower body dressing      What is the patient wearing?: Pants     Lower body assist Assist for lower body dressing: Supervision/Verbal cueing     Toileting Toileting    Toileting assist Assist for toileting: Supervision/Verbal cueing     Transfers Chair/bed transfer  Transfers assist     Chair/bed transfer assist level: Supervision/Verbal cueing     Locomotion Ambulation   Ambulation assist      Assist level: Contact Guard/Touching assist Assistive device: Walker-rolling Max distance: 200'   Walk 10 feet activity   Assist     Assist level: Contact Guard/Touching assist Assistive device: Walker-rolling   Walk 50 feet activity   Assist Walk 50 feet with 2 turns activity did not occur: Safety/medical concerns  Assist level: Contact Guard/Touching assist Assistive device: Walker-rolling    Walk 150 feet activity   Assist Walk 150 feet activity did not  occur: Safety/medical concerns  Assist level: Contact Guard/Touching assist Assistive device: Walker-rolling    Walk 10 feet on uneven surface  activity   Assist Walk 10 feet on uneven surfaces activity did not occur: Refused(Per report )         Wheelchair     Assist Will patient use wheelchair at discharge?: Yes Type of Wheelchair:  Manual Wheelchair activity did not occur: Refused         Wheelchair 50 feet with 2 turns activity    Assist    Wheelchair 50 feet with 2 turns activity did not occur: Refused       Wheelchair 150 feet activity     Assist  Wheelchair 150 feet activity did not occur: Refused       Blood pressure 116/77, pulse 88, temperature 98.6 F (37 C), temperature source Oral, resp. rate 18, height 5\' 9"  (1.753 m), weight 96.9 kg, SpO2 97 %.  Medical Problem List and Plan: 1.Decreased functional mobilitysecondary to SCI/hangman's fractureT1 fracture dislocation T3 compression deformity/status post C6-T4 pedicle screw fixation 07/17/2019.CTOcollar Patient maynot shower- has to keep Cervical collar and thoracic extension in place at all times, reminded patient. `` 3/30- will order honeycomb dressing to be removed- dry dressing  Continue CIR  2. Antithrombotics: -DVT/anticoagulation:Lovenox  Dopplers pending  3/31- Doppler's (-) -antiplatelet therapy: N/A 3. Pain Management:Oxycodone  Changed oxy to 5-10 mg q3 hours prn since pt was supposedly confused today  Pain appears to be predominantly muscular-Robaxin scheduled on 3/28  3/29- changed meds to 5-15 mg q3 hours prn- will see if can tolerate due to sedation/confusion over weekend- also will suggest TrP injections tomorrow  3/30- pt reports pain a little better  3/31- will do trigger point injections tomorrow  4/1- got consent- did trigger point injections into B/L levators, splenius capitus and rhomboids- not near incision after cleaning with alcohol- pt rated pain 4/10- afterwards 3/10 after laid down- ROM objectively looked better and less strain seen on face- upset since doesn't like needles. 4. Mood:Provide emotional support -antipsychotic agents: N/A 5. Neuropsych: This patientiscapable of making decisions on hisown behalf. 6. Skin/Wound Care:Routine skin checks 7.  Fluids/Electrolytes/Nutrition:Routine in and outs 8. Hyperglycemia, likely DM. Noted history of diabetes mellitus on no medications at admission.   Hemoglobin A1c ordered for tomorrow  Metformin started on 3/26, increased on 3/28  Elevated on 3/28   CBG (last 3)  Recent Labs    07/27/19 2120 07/28/19 0613 07/28/19 1202  GLUCAP 135* 167* 190*   3/31- BGs overall doing well- con't metformin- A1c 9.3- Metformin 1000 mg BID- will add Glipizide 2.5 mg daily and monitor  4/1- BGs 135-190- slightly improved with glipizide- still also getting SSI- is upset about that 9. Tobacco abuse. Counseling 10. Tachycardia- mild  11. Hypoxia-   Improving 12. Neurogenic bowel and bladder?  PVRs order x3, pending on 3/28, however appears to be voiding without difficulty  No documented bowel movement, meds increased on 3/28, continue further adjustments as necessary  13. Hyponatremia  Sodium 128 on 3/26  Labs ordered for tomorrow  3/29- Na 129- slightly improved 14.  Leukocytosis  WBCs 12.2 on 3/26  Labs ordered for tomorrpw  3/29- WBC down to 9.3k- will monitor 15.  Acute blood loss anemia  Hemoglobin 11.3 on 3/26  Labs ordered for tomorrow  3/29- Hb 11.3 16. Insomnia  3/30- will increase trazodone  3/31- slept a little better- pain still an issue  4/1- said sleep hard due to pain- but  doesn't want to change meds   I spent a total of 45 minutes of total care today- 15 minutes on rounds, 20 minutes arranging and doing trigger point injections and and 10 minutes doing note/making med adjustments   LOS: 7 days A FACE TO FACE EVALUATION WAS PERFORMED  Charles Perez 07/28/2019, 12:42 PM

## 2019-07-28 NOTE — Progress Notes (Signed)
Physical Therapy Session Note  Patient Details  Name: Charles Perez MRN: [id number] Date of Birth: [birth year]  Today's Date: 07/28/2019 PT Individual Time: 1415-1520 PT Individual Time Calculation (min): 65 min  PT Missed Time: 10 min Missed Time Reason: pain  Short Term Goals: Week 1:  PT Short Term Goal 1 (Week 1): Pt will transfer to [facility] with CGA PT Short Term Goal 2 (Week 1): Pt will ambulate 57ft with supervision assist PT Short Term Goal 3 (Week 1): Pt will initiate stair training. PT Short Term Goal 4 (Week 1): pt will remain out if bed 1 hour between therapies  Skilled Therapeutic Interventions/Progress Updates:    Pt received seated in bed, agreeable to PT session. Pt reports pain is better this date, not rated. Bed mobility Supervision, assisted pt with donning CTO. Pt requests to use the bathroom. Supervision for standing balance while urinating. Ambulation x 200 ft with no AD and CGA for balance. Pt then requests adjustment of CTO, placed washcloths in posterior region of brace and adjusted front of brace for improved fit. Dynamic standing balance: tandem gait 2 x 30 ft with min A for balance; sidesteps L/R 2 x 30 ft each with CGA. Static standing balance while playing Wii bowling with close SBA for balance. Pt requests to use the bathroom. Toilet transfer with Supervision, pt is independent for clothing management and pericare. Pt declines any further participation in therapy session due to pain. Returned to bed with Supervision. Provided ice pack for posterior neck and chest for musculoskeletal pain, pt rates 6/10. Pt left semi-reclined in bed with needs in reach, bed alarm in place. Pt missed 10 min of scheduled therapy session due to pain.  Therapy Documentation Precautions:  Precautions Precautions: Fall, Cervical, Back Precaution Comments: brace must be donned in supine Required Braces or Orthoses: Cervical Brace, Spinal Brace Cervical Brace: Hard collar, At all  times Spinal Brace: Other (comment) Spinal Brace Comments: Brace can be removed in bed Restrictions Weight Bearing Restrictions: Yes    Therapy/Group: Individual Therapy   Peter Congo, PT, DPT  07/28/2019, 3:43 PM

## 2019-07-28 NOTE — Progress Notes (Signed)
Occupational Therapy Session Note  Patient Details  Name: Charles Perez MRN: [id number] Date of Birth: 07/10/1985  Today's Date: 07/28/2019 OT Individual Time: 6720-9470 OT Individual Time Calculation (min): 55 min    Short Term Goals: Week 1:  OT Short Term Goal 1 (Week 1): STGS equal to LTGs set at supervision to modified independent.  Skilled Therapeutic Interventions/Progress Updates:    Pt resting in bed upon arrival with interpreter present.  OT intervention with focus on bed mobility, functional amb with RW, bathing/dressing tasks, toileting, and safety awareness to increase independence with BADLs. Pt requested to use toilet and amb with RW (after CTO donned) to bathroom and completed toileting tasks with supervision.  Pt returned to room and stood at sink to complete LB bathing tasks.  Pt donned new pants while standing alternating LE and using counter for unilateral UE support. Pt amb with RW to ADL apartment for furniture transfers. Pt continues to c/o pressure over sternum from CTO. Pt returned to room and remained in bed with all needs within reach.  Bed alarm activated.   Therapy Documentation Precautions:  Precautions Precautions: Fall, Cervical, Back Precaution Comments: brace must be donned in supine Required Braces or Orthoses: Cervical Brace, Spinal Brace Cervical Brace: Hard collar, At all times Spinal Brace: Other (comment) Spinal Brace Comments: Brace can be removed in bed Restrictions Weight Bearing Restrictions: Yes Pain: Pain Assessment Pain Scale: 0-10 Pain Score: 9  Pain Type: Acute pain Pain Location: Neck Pain Orientation: Mid;Posterior Pain Descriptors / Indicators: Aching Pain Frequency: Constant Pain Onset: On-going Patients Stated Pain Goal: 2 Pain Intervention(s): repositioned and emotional support  Therapy/Group: Individual Therapy  Rich Brave 07/28/2019, 9:00 AM

## 2019-07-28 NOTE — Progress Notes (Signed)
Occupational Therapy Session Note  Patient Details  Name: Charles Perez MRN: [id number] Date of Birth: 04-08-1986  Today's Date: 07/28/2019 OT Individual Time: 1000-1054 OT Individual Time Calculation (min): 54 min    Short Term Goals: Week 1:  OT Short Term Goal 1 (Week 1): STGS equal to LTGs set at supervision to modified independent.  Skilled Therapeutic Interventions/Progress Updates:    Pt resting in bed upon arrival with interpreter present.  OT intervention with focus on toileting, functional amb with/without AD, and standing balance tasks.  Pt amb with RW (after CTO donned) to gym and engaged in tossing 1kg ball against mini trampoline. Pt then amb with RW to day room and rested while OTA started to set up Wii.  Pt commented that he needed to use the bathroom for BM. Pt amb without AD to room with CGA.  Toilet transfer and toileting tasks with supervison. Pt states he is concerned about managing pain when he goes home.  Pt reports pain is mainly in back of neck when resting and on chest with CTO donned.  Pt remained in bed with all needs within reach and bed alarm activated.   Therapy Documentation Precautions:  Precautions Precautions: Fall, Cervical, Back Precaution Comments: brace must be donned in supine Required Braces or Orthoses: Cervical Brace, Spinal Brace Cervical Brace: Hard collar, At all times Spinal Brace: Other (comment) Spinal Brace Comments: Brace can be removed in bed Restrictions Weight Bearing Restrictions: Yes   Pain: Pt initially c/o 7/10 pain in posterior neck but increased to 9/10 with extended activity; repositioned.  Pt also c/o pressure of CTO on sternum which is relieved when CTO removed.   Therapy/Group: Individual Therapy  Rich Brave 07/28/2019, 10:56 AM

## 2019-07-28 NOTE — Discharge Summary (Signed)
Physician Discharge Summary  Patient ID: Charles Perez MRN: [id number] DOB/AGE: July 22, 1985 34 y.o.  Admit date: 07/21/2019 Discharge date: 07/30/2019  Discharge Diagnoses:  Principal Problem:   Quadriplegia and quadriparesis (HCC) Active Problems:   Hangman's fracture (HCC)   Postoperative pain   Hyperglycemia   Hyponatremia   Leukocytosis   Acute blood loss anemia   Slow transit constipation DVT prophylaxis Tobacco abuse Neurogenic bowel and bladder  Discharged Condition: Stable  Significant Diagnostic Studies: CT Angio Head W or Wo Contrast  Result Date: 07/16/2019 CLINICAL DATA:  Cervical spine fracture. Status post trauma. Multiple cervical spine fractures. Additional history provided: Poly trauma, critical, head/cervical spine injury suspected. EXAM: CT ANGIOGRAPHY HEAD AND NECK TECHNIQUE: Multidetector CT imaging of the head and neck was performed using the standard protocol during bolus administration of intravenous contrast. Multiplanar CT image reconstructions and MIPs were obtained to evaluate the vascular anatomy. Carotid stenosis measurements (when applicable) are obtained utilizing NASCET criteria, using the distal internal carotid diameter as the denominator. CONTRAST:  67mL OMNIPAQUE IOHEXOL 350 MG/ML SOLN COMPARISON:  CT head/cervical spine performed earlier the same day 07/16/2019 FINDINGS: CTA NECK FINDINGS Aortic arch: Common origin of the innominate and left common carotid arteries. The visualized aortic arch is unremarkable. No innominate or proximal subclavian artery stenosis. Right carotid system: The CCA and ICA are smooth and patent within the neck without stenosis. Left carotid system: CCA and ICA patent within the neck without stenosis Vertebral arteries: The vertebral arteries are codominant and patent within the neck without stenosis. No evidence of dissection or pseudoaneurysm. Skeleton: Please refer to same day CT cervical spine for a description of multiple  acute cervical and upper thoracic spine fractures. Other neck: No neck mass or cervical lymphadenopathy. Upper chest: No consolidation within the imaged lung apices. Review of the MIP images confirms the above findings CTA HEAD FINDINGS Anterior circulation: The intracranial internal carotid arteries are patent without significant stenosis. The M1 middle cerebral arteries are patent without significant stenosis. No M2 proximal branch occlusion or high-grade proximal stenosis is identified. The anterior cerebral arteries are patent without high-grade proximal stenosis. No intracranial aneurysm is identified. Posterior circulation: The intracranial vertebral arteries are patent without stenosis, as is the basilar artery. The posterior cerebral arteries are patent proximally without significant stenosis. A small left posterior communicating artery is present. A right posterior communicating artery is not definitively identified. Venous sinuses: Within limitations of contrast timing, no convincing thrombus. Anatomic variants: As described Review of the MIP images confirms the above findings IMPRESSION: CTA neck: 1. The bilateral common carotid, internal carotid and vertebral arteries are patent within the neck without evidence of traumatic vascular injury. 2. Please refer to same day CT cervical spine for a description of multiple acute fractures of the cervical and upper thoracic spine. CTA head: Unremarkable examination. No intracranial large vessel occlusion or proximal high-grade arterial stenosis. Electronically Signed   By: Jackey Loge DO   On: 07/16/2019 14:49   DG Thoracic Spine 2 View  Result Date: 07/16/2019 CLINICAL DATA:  Pain after trauma. EXAM: THORACIC SPINE 2 VIEWS COMPARISON:  None. FINDINGS: Mild curvature of the thoracic spine, apex to the right. No other malalignment. No fractures are seen. Minimal degenerative disc disease. IMPRESSION: Mild scoliotic curvature of the thoracic spine, apex to the  right. Mild degenerative changes. No other abnormalities. Electronically Signed   By: Gerome Sam III M.D   On: 07/16/2019 12:05   CT Head Wo Contrast  Result Date: 07/16/2019 CLINICAL  DATA:  Neck pain after being hit in head by falling wall. EXAM: CT HEAD WITHOUT CONTRAST CT CERVICAL SPINE WITHOUT CONTRAST TECHNIQUE: Multidetector CT imaging of the head and cervical spine was performed following the standard protocol without intravenous contrast. Multiplanar CT image reconstructions of the cervical spine were also generated. COMPARISON:  None. FINDINGS: CT HEAD FINDINGS Brain: No evidence of acute infarction, hemorrhage, hydrocephalus, extra-axial collection or mass lesion/mass effect. Vascular: No hyperdense vessel or unexpected calcification. Skull: Normal. Negative for fracture or focal lesion. Sinuses/Orbits: Left maxillary mucous retention cyst is noted. Other: None. CT CERVICAL SPINE FINDINGS Alignment: Normal. Skull base and vertebrae: There appears to be a moderately compressed T1 fracture with possible posterior displacement of fracture fragment into spinal canal. Moderately displaced fracture is seen involving the posterior spinous process of C7. Mildly displaced fractures are seen involving the bilateral pedicles of C2. Moderately displaced fracture is also seen involving the left lateral mass of C2, with disruption of the left transverse foramina. Fracture is also seen extending through the right transverse foramen. Soft tissues and spinal canal: No prevertebral fluid or swelling. No visible canal hematoma. Disc levels:  Disc spaces are maintained. Upper chest: Negative. Other: None. IMPRESSION: 1. Normal head CT. 2. Mildly displaced fractures are seen involving the bilateral pedicles of C2. Moderately displaced fracture is also seen involving the left lateral mass of C2, with disruption of the left transverse foramina. Fracture is also seen extending through the right transverse foramen. This  is concerning for vertebral artery injury, and CT angiography of the neck is recommended for further evaluation 3. Moderately displaced fracture is seen involving the posterior spinous process of C7, with mildly displaced fracture involving distal tip of spinous process of T1. 4. Moderately compressed T1 vertebral body fracture is noted with possible posterior displacement of fracture fragment into spinal canal. Critical Value/emergent results were called by telephone at the time of interpretation on 07/16/2019 at 1:15 pm to provider [facility] , who verbally acknowledged these results. Electronically Signed   By: Lupita Raider M.D.   On: 07/16/2019 13:16   CT Angio Neck W and/or Wo Contrast  Result Date: 07/16/2019 CLINICAL DATA:  Cervical spine fracture. Status post trauma. Multiple cervical spine fractures. Additional history provided: Poly trauma, critical, head/cervical spine injury suspected. EXAM: CT ANGIOGRAPHY HEAD AND NECK TECHNIQUE: Multidetector CT imaging of the head and neck was performed using the standard protocol during bolus administration of intravenous contrast. Multiplanar CT image reconstructions and MIPs were obtained to evaluate the vascular anatomy. Carotid stenosis measurements (when applicable) are obtained utilizing NASCET criteria, using the distal internal carotid diameter as the denominator. CONTRAST:  50mL OMNIPAQUE IOHEXOL 350 MG/ML SOLN COMPARISON:  CT head/cervical spine performed earlier the same day 07/16/2019 FINDINGS: CTA NECK FINDINGS Aortic arch: Common origin of the innominate and left common carotid arteries. The visualized aortic arch is unremarkable. No innominate or proximal subclavian artery stenosis. Right carotid system: The CCA and ICA are smooth and patent within the neck without stenosis. Left carotid system: CCA and ICA patent within the neck without stenosis Vertebral arteries: The vertebral arteries are codominant and patent within the neck without  stenosis. No evidence of dissection or pseudoaneurysm. Skeleton: Please refer to same day CT cervical spine for a description of multiple acute cervical and upper thoracic spine fractures. Other neck: No neck mass or cervical lymphadenopathy. Upper chest: No consolidation within the imaged lung apices. Review of the MIP images confirms the above findings CTA HEAD FINDINGS  Anterior circulation: The intracranial internal carotid arteries are patent without significant stenosis. The M1 middle cerebral arteries are patent without significant stenosis. No M2 proximal branch occlusion or high-grade proximal stenosis is identified. The anterior cerebral arteries are patent without high-grade proximal stenosis. No intracranial aneurysm is identified. Posterior circulation: The intracranial vertebral arteries are patent without stenosis, as is the basilar artery. The posterior cerebral arteries are patent proximally without significant stenosis. A small left posterior communicating artery is present. A right posterior communicating artery is not definitively identified. Venous sinuses: Within limitations of contrast timing, no convincing thrombus. Anatomic variants: As described Review of the MIP images confirms the above findings IMPRESSION: CTA neck: 1. The bilateral common carotid, internal carotid and vertebral arteries are patent within the neck without evidence of traumatic vascular injury. 2. Please refer to same day CT cervical spine for a description of multiple acute fractures of the cervical and upper thoracic spine. CTA head: Unremarkable examination. No intracranial large vessel occlusion or proximal high-grade arterial stenosis. Electronically Signed   By: Jackey Loge DO   On: 07/16/2019 14:49   CT Cervical Spine Wo Contrast  Result Date: 07/16/2019 CLINICAL DATA:  Neck pain after being hit in head by falling wall. EXAM: CT HEAD WITHOUT CONTRAST CT CERVICAL SPINE WITHOUT CONTRAST TECHNIQUE: Multidetector  CT imaging of the head and cervical spine was performed following the standard protocol without intravenous contrast. Multiplanar CT image reconstructions of the cervical spine were also generated. COMPARISON:  None. FINDINGS: CT HEAD FINDINGS Brain: No evidence of acute infarction, hemorrhage, hydrocephalus, extra-axial collection or mass lesion/mass effect. Vascular: No hyperdense vessel or unexpected calcification. Skull: Normal. Negative for fracture or focal lesion. Sinuses/Orbits: Left maxillary mucous retention cyst is noted. Other: None. CT CERVICAL SPINE FINDINGS Alignment: Normal. Skull base and vertebrae: There appears to be a moderately compressed T1 fracture with possible posterior displacement of fracture fragment into spinal canal. Moderately displaced fracture is seen involving the posterior spinous process of C7. Mildly displaced fractures are seen involving the bilateral pedicles of C2. Moderately displaced fracture is also seen involving the left lateral mass of C2, with disruption of the left transverse foramina. Fracture is also seen extending through the right transverse foramen. Soft tissues and spinal canal: No prevertebral fluid or swelling. No visible canal hematoma. Disc levels:  Disc spaces are maintained. Upper chest: Negative. Other: None. IMPRESSION: 1. Normal head CT. 2. Mildly displaced fractures are seen involving the bilateral pedicles of C2. Moderately displaced fracture is also seen involving the left lateral mass of C2, with disruption of the left transverse foramina. Fracture is also seen extending through the right transverse foramen. This is concerning for vertebral artery injury, and CT angiography of the neck is recommended for further evaluation 3. Moderately displaced fracture is seen involving the posterior spinous process of C7, with mildly displaced fracture involving distal tip of spinous process of T1. 4. Moderately compressed T1 vertebral body fracture is noted with  possible posterior displacement of fracture fragment into spinal canal. Critical Value/emergent results were called by telephone at the time of interpretation on 07/16/2019 at 1:15 pm to provider [facility] , who verbally acknowledged these results. Electronically Signed   By: Lupita Raider M.D.   On: 07/16/2019 13:16   CT THORACIC SPINE WO CONTRAST  Result Date: 07/16/2019 CLINICAL DATA:  Fracture EXAM: CT THORACIC SPINE WITHOUT CONTRAST TECHNIQUE: Multidetector CT images of the thoracic were obtained using the standard protocol without intravenous contrast. COMPARISON:  None. FINDINGS:  Alignment: Osseous retropulsion at the fracture levels. Otherwise unremarkable. Vertebrae: Acute fracture of T1 vertebral body is again identified with anterior wedging and less than 50% loss height. There is retropulsion of the inferior endplate. Acute fracture of T3 vertebral body with severe loss height also again noted. There is osseous retropulsion the posterior vertebral body. Additionally, there is a nondisplaced fracture through right T3 transverse process extending to the articular process. As seen on prior imaging, there is also a fracture through the C7 spinous process extending into the lamina. Paraspinal and other soft tissues: Upper thoracic paraspinal hematoma. Disc levels: Intervertebral disc heights are maintained. IMPRESSION: As seen previously, there are acute fractures of T1 and T3 with osseous retropulsion. There is also a nondisplaced fracture through the right T3 transverse process extending to the articular process. Electronically Signed   By: Guadlupe Spanish M.D.   On: 07/16/2019 19:36   MR Cervical Spine Wo Contrast  Result Date: 07/16/2019 CLINICAL DATA:  Spine fracture EXAM: MRI CERVICAL AND THORACIC SPINE WITHOUT CONTRAST TECHNIQUE: Multiplanar and multiecho pulse sequences of the cervical spine, to include the craniocervical junction and cervicothoracic junction, and the thoracic spine, were  obtained without intravenous contrast. COMPARISON:  Correlation made with CTA earlier same day FINDINGS: MRI CERVICAL SPINE Motion artifact. Alignment: Alignment is unchanged from CT. Vertebrae: C2 vertebral body fractures are more completely evaluated on the prior CT. There is some associated marrow edema. Linear STIR hyperintensity underlying the C5 superior endplate may reflect a fracture. No evidence of anterior or posterior longitudinal ligament disruption. Ligamentum flavum appears intact. Cord: No abnormal cord signal. Posterior Fossa, vertebral arteries, paraspinal tissues: Prevertebral soft tissue edema is present at the cervicothoracic junction. Midline and parasagittal soft tissue edema is present in the dorsal paraspinal muscles. Disc levels: No significant degenerative stenosis. MRI THORACIC SPINE Alignment:  Osseous retropulsion at T1 and T3. Vertebrae: Fracture of the T1 vertebral body with compression deformity as seen on prior CT with associated marrow edema. There is also severe compression deformity of T3. Remainder of thoracic vertebral body heights are maintained. No suspicious osseous lesion. Cord: No abnormal cord signal. STIR hyperintensity in the dorsal epidural space primarily from C7-T3 levels. Paraspinal and other soft tissues: Paravertebral edema/hematoma at the upper thoracic spine. Disc levels: There is mild to moderate stenosis at the T1-T2 and T3 levels. No significant degenerative stenosis. IMPRESSION: Fracture of C2 as seen on CT. Question of fracture underlying the C5 superior endplate. No evidence of significant ligamentous injury. Likely posttraumatic cervical dorsal paraspinal soft tissue edema. Fractures of T1 and T3 as seen on CT with endplate retropulsion and mild to moderate canal stenosis. Abnormal dorsal epidural signal primarily from C7-T3 levels may reflect epidural hemorrhage without mass effect. Electronically Signed   By: Guadlupe Spanish M.D.   On: 07/16/2019 18:18    MR THORACIC SPINE WO CONTRAST  Result Date: 07/16/2019 CLINICAL DATA:  Spine fracture EXAM: MRI CERVICAL AND THORACIC SPINE WITHOUT CONTRAST TECHNIQUE: Multiplanar and multiecho pulse sequences of the cervical spine, to include the craniocervical junction and cervicothoracic junction, and the thoracic spine, were obtained without intravenous contrast. COMPARISON:  Correlation made with CTA earlier same day FINDINGS: MRI CERVICAL SPINE Motion artifact. Alignment: Alignment is unchanged from CT. Vertebrae: C2 vertebral body fractures are more completely evaluated on the prior CT. There is some associated marrow edema. Linear STIR hyperintensity underlying the C5 superior endplate may reflect a fracture. No evidence of anterior or posterior longitudinal ligament disruption. Ligamentum flavum appears intact.  Cord: No abnormal cord signal. Posterior Fossa, vertebral arteries, paraspinal tissues: Prevertebral soft tissue edema is present at the cervicothoracic junction. Midline and parasagittal soft tissue edema is present in the dorsal paraspinal muscles. Disc levels: No significant degenerative stenosis. MRI THORACIC SPINE Alignment:  Osseous retropulsion at T1 and T3. Vertebrae: Fracture of the T1 vertebral body with compression deformity as seen on prior CT with associated marrow edema. There is also severe compression deformity of T3. Remainder of thoracic vertebral body heights are maintained. No suspicious osseous lesion. Cord: No abnormal cord signal. STIR hyperintensity in the dorsal epidural space primarily from C7-T3 levels. Paraspinal and other soft tissues: Paravertebral edema/hematoma at the upper thoracic spine. Disc levels: There is mild to moderate stenosis at the T1-T2 and T3 levels. No significant degenerative stenosis. IMPRESSION: Fracture of C2 as seen on CT. Question of fracture underlying the C5 superior endplate. No evidence of significant ligamentous injury. Likely posttraumatic cervical  dorsal paraspinal soft tissue edema. Fractures of T1 and T3 as seen on CT with endplate retropulsion and mild to moderate canal stenosis. Abnormal dorsal epidural signal primarily from C7-T3 levels may reflect epidural hemorrhage without mass effect. Electronically Signed   By: Macy Mis M.D.   On: 07/16/2019 18:18   DG Thoracic Spine 1 View  Result Date: 07/17/2019 CLINICAL DATA:  C6-T6 fusion. Fluoroscopy time 3 minutes 27 seconds. EXAM: DG C-ARM 1-60 MIN; THORACIC SPINE - 1 VIEW CONTRAST:  Not applicable FLUOROSCOPY TIME:  Fluoroscopy Time:  3 minutes 27 seconds COMPARISON:  CT of the thoracic spine on 07/16/2019 FINDINGS: Four images are submitted, demonstrating surgical access and placement of transpedicular screws. Transpedicular screws are identified at C6, C7, T1, T2, T3, T4, and T5. By history fusion extends to T6. IMPRESSION: Intraoperative images during thoracic spine fusion. Electronically Signed   By: Nolon Nations M.D.   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 FINDINGS: No convincing evidence of bony pelvic fracture or diastasis though the sacrum is partially obscured by contrast material within the urinary bladder in the femoral necks are externally rotated superimposing the greater trochanters. Radiodensities project over the left L4 transverse process and left ilium, nonspecific. Remaining soft tissues are unremarkable. IMPRESSION: No convincing evidence of bony pelvic fracture or diastasis though the sacrum is partially obscured by contrast material within the bladder. If there is concern for sacral injury, drainage of the bladder could be performed with reimaging. Alternatively, could pursue cross-sectional imaging. Femoral necks are externally rotated superimposing the greater trochanters could limit detection of femoral neck fractures. If  there is concern, dedicated hip radiograph should be obtained. Electronically Signed   By: Lovena Le M.D.   On: 07/16/2019 20:34   DG Chest Port 1 View  Result Date: 07/16/2019 CLINICAL DATA:  Wall fell on patient. EXAM: PORTABLE CHEST 1 VIEW COMPARISON:  None. FINDINGS: The heart size and mediastinal contours are within normal limits. Both lungs are clear. The visualized skeletal structures are unremarkable. No pneumothorax. IMPRESSION: Negative. Electronically Signed   By: Rolm Baptise M.D.   On: 07/16/2019 20:32   DG C-Arm 1-60 Min  Result Date: 07/17/2019 CLINICAL DATA:  C6-T6 fusion. Fluoroscopy time 3 minutes 27 seconds. EXAM: DG C-ARM 1-60 MIN; THORACIC SPINE - 1 VIEW CONTRAST:  Not applicable FLUOROSCOPY TIME:  Fluoroscopy Time:  3 minutes 27 seconds COMPARISON:  CT of the thoracic  spine on 07/16/2019 FINDINGS: Four images are submitted, demonstrating surgical access and placement of transpedicular screws. Transpedicular screws are identified at C6, C7, T1, T2, T3, T4, and T5. By history fusion extends to T6. IMPRESSION: Intraoperative images during thoracic spine fusion. Electronically Signed   By: Norva Pavlov M.D.   On: 07/17/2019 12:44   VAS [place] LOWER EXTREMITY VENOUS (DVT)  Result Date: 07/24/2019  Lower Venous DVTStudy Indications: Trauma/Rehab.  Comparison Study: No prior study on file Performing Technologist: Sherren Kerns RVS  Examination Guidelines: A complete evaluation includes B-mode imaging, spectral Doppler, color Doppler, and power Doppler as needed of all accessible portions of each vessel. Bilateral testing is considered an integral part of a complete examination. Limited examinations for reoccurring indications may be performed as noted. The reflux portion of the exam is performed with the patient in reverse Trendelenburg.  +---------+---------------+---------+-----------+----------+--------------+ RIGHT    CompressibilityPhasicitySpontaneityPropertiesThrombus  Aging +---------+---------------+---------+-----------+----------+--------------+ CFV      Full           Yes      Yes                                 +---------+---------------+---------+-----------+----------+--------------+ SFJ      Full                                                        +---------+---------------+---------+-----------+----------+--------------+ FV Prox  Full                                                        +---------+---------------+---------+-----------+----------+--------------+ FV Mid   Full                                                        +---------+---------------+---------+-----------+----------+--------------+ FV DistalFull                                                        +---------+---------------+---------+-----------+----------+--------------+ PFV      Full                                                        +---------+---------------+---------+-----------+----------+--------------+ POP      Full           Yes      Yes                                 +---------+---------------+---------+-----------+----------+--------------+ PTV      Full                                                        +---------+---------------+---------+-----------+----------+--------------+  PERO     Full                                                        +---------+---------------+---------+-----------+----------+--------------+   +---------+---------------+---------+-----------+----------+--------------+ LEFT     CompressibilityPhasicitySpontaneityPropertiesThrombus Aging +---------+---------------+---------+-----------+----------+--------------+ CFV      Full           Yes      Yes                                 +---------+---------------+---------+-----------+----------+--------------+ SFJ      Full                                                         +---------+---------------+---------+-----------+----------+--------------+ FV Prox  Full                                                        +---------+---------------+---------+-----------+----------+--------------+ FV Mid   Full                                                        +---------+---------------+---------+-----------+----------+--------------+ FV DistalFull                                                        +---------+---------------+---------+-----------+----------+--------------+ PFV      Full                                                        +---------+---------------+---------+-----------+----------+--------------+ POP      Full           Yes      Yes                                 +---------+---------------+---------+-----------+----------+--------------+ PTV      Full                                                        +---------+---------------+---------+-----------+----------+--------------+ PERO     Full                                                        +---------+---------------+---------+-----------+----------+--------------+  Summary: BILATERAL: - No evidence of deep vein thrombosis seen in the lower extremities, bilaterally.   *See table(s) above for measurements and observations. Electronically signed by Waverly Ferrari MD on 07/24/2019 at 1:46:06 PM.    Final     Labs:  Basic Metabolic Panel: Recent Labs  Lab 07/22/19 0537 07/25/19 0726  NA 128* 129*  K 3.7 3.8  CL 89* 89*  CO2 28 28  GLUCOSE 262* 234*  BUN 6 7  CREATININE 0.63 0.67  CALCIUM 8.4* 8.8*    CBC: Recent Labs  Lab 07/22/19 0537 07/25/19 0726  WBC 12.2* 9.3  NEUTROABS 7.6 5.3  HGB 11.3* 11.3*  HCT 31.9* 32.8*  MCV 95.8 95.1  PLT 387 509*    CBG: Recent Labs  Lab 07/26/19 2126 07/27/19 0617 07/27/19 1145 07/27/19 1639 07/27/19 2120  GLUCAP 147* 169* 305* 154* 135*   Family history.  Mother with cancer Father  with diabetes.  Denies any rectal cancer esophageal cancer or colon cancer  Brief HPI:   Charles Perez is a 34 y.o. right-handed male on no prescription medications history of tobacco abuse as well as diabetes mellitus.  Per chart review lives with girlfriend and 43 year old son.  Independent prior to admission.  Presented 07/16/2019 after a 16 foot wall collapsed on him.  He did have transient loss of consciousness.  Complains of back neck and upper back pain.  Admission chemistries unremarkable except WBC 19,800, glucose 299, sodium 132.  Cranial CT scan negative.  CT cervical spine showed mildly displaced fracture involving the bilateral pedicles of C2.  Moderately displaced fracture also seen involving the left lateral mass of C2 with disruption of left transverse foramina.  Fracture also extending through the right transverse foramen.  There was some concern for vertebral artery injury.  Moderately displaced fracture seen involving the posterior spinous process of C7 with mildly displaced fracture involving the distal tip of spinous process of T11.  Moderately compressed T1 vertebral body fracture noted with possible posterior displacement of fracture fragment into the spinal canal.  CTA head neck without vascular injury or occlusion.  Hangman fracture and patient underwent posterior cervical thoracic fixation C6-T4 and posterior lateral arthrodesis C6-T4 placement of lateral mass screws at C6 pedicle screws and C7-T1-2-3 and T4 bilaterally 07/17/2019 per Dr. Wynetta Emery.  CTO collar at all times.  Patient was admitted for a comprehensive rehab program.    Hospital Course: Taran Haynesworth was admitted to rehab 07/21/2019 for inpatient therapies to consist of PT, ST and OT at least three hours five days a week. Past admission physiatrist, therapy team and rehab RN have worked together to provide customized collaborative inpatient rehab.  Pertaining to patient's spinal cord injury hangman's fracture T1 fracture  dislocation T3 compression deformity status post C6-T4 pedicle screw fixation 07/17/2019.  CTO collar at all times he would follow-up neurosurgery.  Honeycomb dressing had been removed.  Subcutaneous Lovenox for DVT prophylaxis patient ambulatory venous Doppler studies negative.  Pain management with the use of oxycodone and Robaxin as directed.  Hyperglycemia noted history of diabetes mellitus patient on no diabetic agents prior to admission hemoglobin A1c 9.3 and patient placed on Glucotrol as well as the addition of Glucophage 1000 mg twice daily and full diabetic teaching.  Bouts of insomnia did improve with trazodone he would not need this on discharge.  Bowel program established.  Neurogenic bladder patient now voiding much better post void residuals improved.   Blood pressures were monitored on TID basis and controlled  Diabetes has been monitored with ac/hs CBG checks and SSI was use prn for tighter BS control.   He/ is continent of bowel and bladder.  He/ has made gains during rehab stay and is attending therapies  He/ will continue to receive follow up therapies after discharge  Rehab course: During patient's stay in rehab weekly team conferences were held to monitor patient's progress, set goals and discuss barriers to discharge. At admission, patient required minimal assist ambulate 22 feet rolling walker minimal assist sit to stand, moderate assist side-lying to sitting.  Moderate assist upper body bathing max is lower body bathing max assist upper body dressing max assist lower body dressing  Physical exam.  Blood pressure 155/90 pulse 107 temperature 98.5 respiration 25 oxygen saturation 92% room air Constitutional.  Alert and oriented with the assistance of an interpreter. HEENT Head.  Normocephalic and atraumatic Neck.  CTO brace in place Eyes.  Pupils round and reactive to light no discharge.nystagmus Cardiac regular rate rhythm without any extra sounds or murmur heard Abdomen.   Soft nontender positive bowel sounds without rebound Respiratory effort normal no respiratory distress without wheeze Musculoskeletal.  Biceps 4 out of 5 triceps 4 out of 5 wrist extension 4 out of 5 grip 4- out of 5 finger 5- out of 5 Lower extremities 5- out of 5 hip flexors knee extension dorsiflexion plantarflexion and EHLs.  Sensation intact to light touch  He/  has had improvement in activity tolerance, balance, postural control as well as ability to compensate for deficits. He/ has had improvement in functional use RUE/LUE  and RLE/LLE as well as improvement in awareness.  Moderate assist to don brace in supine via rolling and supervision level.  Supine to sit with supervision.  Sit to stand with supervision rolling walker.  Ambulates 200 feet rolling walker supervision contact-guard assist.  Up-and-down 12 stairs with handrails contact-guard assist.  Demonstrates good return demonstration of how to perform all stretching exercises.  Rolling in bed with supervision.  Ambulates to the bathroom rolling walker stood at toilet to urinate.  He needs very little assistance for lower body ADLs.  Full family teaching completed plan discharge to home       Disposition: Discharged to home    Diet: Diabetic diet  Special Instructions: No drinking driving smoking or alcohol  CTO back brace at all times  Follow-up with PCP for diabetic management  Medications at discharge 1.  Tylenol as needed 2.  Colace 100 mg p.o. twice daily 3.  Glucotrol 2.5 mg p.o. daily 4.  Glucophage 1000 mg p.o. twice daily 5.  Robaxin-750 milligrams p.o. 3 times daily 6.  Oxycodone 10 every 4 hours as needed pain 7.  Protonix 40 mg p.o. nightly 8.  MiraLAX twice daily hold for loose stools 9.  Senokot 1 tablet p.o. 2 times daily hold for loose stools  Discharge Instructions    Ambulatory referral to Physical Medicine Rehab   Complete by: As directed    Moderate complexity follow-up 1 to 2 weeks spinal cord  injury with hangman's fracture      Follow-up Information    Lovorn, Aundra MilletMegan, MD Follow up.   Specialty: Physical Medicine and Rehabilitation Why: Office to call for appointment Contact information: [address] [place] [address] [phone]        Donalee Citrinram, Gary, MD Follow up.   Specialty: Neurosurgery Why: Call for appointment Contact information: [address] [place] [address] [phone]  Signed: Mcarthur Rossetti Shekela Goodridge 07/28/2019, 5:22 AM

## 2019-07-29 ENCOUNTER — Inpatient Hospital Stay (HOSPITAL_COMMUNITY): Payer: Self-pay

## 2019-07-29 ENCOUNTER — Inpatient Hospital Stay (HOSPITAL_COMMUNITY): Payer: Self-pay | Admitting: Physical Therapy

## 2019-07-29 LAB — GLUCOSE, CAPILLARY
Glucose-Capillary: 160 mg/dL — ABNORMAL HIGH (ref 70–99)
Glucose-Capillary: 146 mg/dL — ABNORMAL HIGH (ref 70–99)
Glucose-Capillary: 140 mg/dL — ABNORMAL HIGH (ref 70–99)
Glucose-Capillary: 143 mg/dL — ABNORMAL HIGH (ref 70–99)

## 2019-07-29 MED ORDER — LIVING WELL WITH DIABETES BOOK - IN SPANISH
Freq: Once | Status: AC
  Filled 2019-07-29: qty 1

## 2019-07-29 MED ORDER — LIVING WELL WITH DIABETES BOOK
Freq: Once | Status: DC
  Filled 2019-07-29: qty 1

## 2019-07-29 NOTE — Progress Notes (Addendum)
Social Work Discharge Note   The overall goal for the admission was met for:   Discharge location: Yes, discharging home  Length of Stay: Yes, 7 Days  Discharge activity level: Yes. Independent with assistive device/Contact guard Assist   Home/community participation: Yes  Services provided included: MD, RD, PT, OT, SLP, RN, CM, TR, Pharmacy, Neuropsych and SW  Financial Services: Other: Uninsured  Follow-up services arranged: DME: Rolling Walker and Other: M[employer] Medication Discount Card  Comments (or additional information):  Patient/Family verbalized understanding of follow-up arrangements: Yes  Individual responsible for coordination of the follow-up plan: Patient responsible for self [phone]  Confirmed correct DME delivered: Dyanne Iha 07/29/2019     Dyanne Iha

## 2019-07-29 NOTE — Discharge Summary (Signed)
Occupational Therapy Discharge Summary  Patient Details  Name: Charles Perez MRN: [id number] Date of Birth: January 07, 1986  Patient has met 71 of 13 long term goals due to improved activity tolerance, improved balance, ability to compensate for deficits, improved awareness and improved coordination.  Pt made excellent progress with BADLs and IADLs during this admission.  Pt requires min A for donning CTO but completes all bathing/dressing tasks without assistance (mod I). Pt is mod I for toilet transfers, toileting, and simple homemaking and kitchen tasks.  Pt verbalizes understanding of of back/cervical precautions and MD orders regarding wearing of CTO. Family has not been present for therapy/education. Patient to discharge at overall Modified Independent level.   Recommendation:  No OT f/u recommended   Equipment: No equipment provided  Reasons for discharge: treatment goals met and discharge from hospital  Patient/family agrees with progress made and goals achieved: Yes  OT Discharge Vision Baseline Vision/History: No visual deficits Patient Visual Report: No change from baseline Vision Assessment?: No apparent visual deficits Perception  Perception: Within Functional Limits Praxis Praxis: Intact Cognition Overall Cognitive Status: Within Functional Limits for tasks assessed Arousal/Alertness: Awake/alert Orientation Level: Oriented X4 Attention: Sustained;Selective Sustained Attention: Appears intact Selective Attention: Appears intact Memory: Appears intact Immediate Memory Recall: Sock;Blue;Bed Memory Recall Sock: Without Cue Memory Recall Blue: Without Cue Memory Recall Bed: Without Cue Awareness: Appears intact Problem Solving: Appears intact Safety/Judgment: Appears intact Sensation Sensation Light Touch: Appears Intact Hot/Cold: Appears Intact Proprioception: Appears Intact Stereognosis: Not tested Motor  Motor Motor: Within Functional Limits Trunk/Postural  Assessment  Cervical Assessment Cervical Assessment: Exceptions to WFL(CTO brace) Thoracic Assessment Thoracic Assessment: Exceptions to WFL(CTO brace) Lumbar Assessment Lumbar Assessment: Exceptions to [facility] Postural Control Postural Control: Within Functional Limits  Balance Static Sitting Balance Static Sitting - Balance Support: Feet supported Static Sitting - Level of Assistance: 7: Independent Dynamic Sitting Balance Dynamic Sitting - Balance Support: Feet supported;During functional activity Dynamic Sitting - Level of Assistance: 7: Independent Extremity/Trunk Assessment RUE Assessment RUE Assessment: Within Functional Limits LUE Assessment LUE Assessment: Within Functional Limits   Leroy Libman 07/29/2019, 12:34 PM

## 2019-07-29 NOTE — Progress Notes (Signed)
Physical Therapy Discharge Summary  Patient Details  Name: Charles Perez MRN: [id number] Date of Birth: [birth year]  Today's Date: 07/29/2019 PT Individual Time: 2500-3704 PT Individual Time Calculation (min): 70 min    Patient has met 7 of 7 long term goals due to improved activity tolerance, improved balance, improved postural control, increased strength, decreased pain and ability to compensate for deficits.  Patient to discharge at an ambulatory level Supervision to mod I overall.   Patient's care partner is independent to provide the necessary physical assistance at discharge. Pt's family is able to provide Supervision assist upon d/c home.  Reasons goals not met: Patient has met all rehab goals.  Recommendation:  Patient will not require PT services upon d/c home.  Equipment: RW  Reasons for discharge: treatment goals met and discharge from hospital  Patient/family agrees with progress made and goals achieved: Yes   Skilled Intervention: Pt received seated in bed, agreeable to PT session. Pt reports minimal pain at rest. Assisted pt with donning CTO, at mod I level for bed mobility with increased time needed. Pt is mod I for transfers with or without use of RW. Gait up to 200 ft with no AD at Supervision level, mod I with use of RW. Car transfer with Supervision with v/c for safe transfer technique. Ambulation up/down ramp and across uneven surface with Supervision. Ascend/descend 12 stairs with 2 handrails at Supervision level. Patient demonstrates increased fall risk as noted by score of 48/56 on Berg Balance Scale.  (<36= high risk for falls, close to 100%; 37-45 significant >80%; 46-51 moderate >50%; 52-55 lower >25%). Reviewed score and functional implications. Static standing balance on airex with CGA performing ball toss against rebounder, 3 x 15 reps. Dynamic standing balance performing sidesteps with alt L/R toe-taps 2 x 20 ft with CGA. Pt reports onset of posterior neck pain at  end of session but declines intervention. Mod I for sit to supine and assist to doff brace. Pt left semi-reclined in bed with needs in reach at end of session.  PT Discharge Precautions/Restrictions Precautions Precautions: Fall;Cervical;Back Precaution Comments: brace must be donned in supine Required Braces or Orthoses: Cervical Brace;Spinal Brace Cervical Brace: Hard collar;At all times Spinal Brace: Other (comment) Spinal Brace Comments: CTO donned in supine Restrictions Weight Bearing Restrictions: No Vital Signs Therapy Vitals Temp: 99 F (37.2 C) Temp Source: Oral Pulse Rate: 83 Resp: 18 BP: 116/75 Patient Position (if appropriate): Lying Oxygen Therapy SpO2: 99 % O2 Device: Room Air Pain Pain Assessment Pain Scale: 0-10 Pain Score: 7  Pain Type: Surgical pain Pain Location: Neck Pain Orientation: Mid Pain Descriptors / Indicators: Aching Pain Frequency: Constant Pain Onset: On-going Patients Stated Pain Goal: 2 Pain Intervention(s): Medication (See eMAR) Vision/Perception  Perception Perception: Within Functional Limits Praxis Praxis: Intact  Cognition Overall Cognitive Status: Within Functional Limits for tasks assessed Arousal/Alertness: Awake/alert Orientation Level: Oriented X4 Attention: Sustained;Selective Sustained Attention: Appears intact Selective Attention: Appears intact Memory: Appears intact Immediate Memory Recall: Sock;Blue;Bed Memory Recall Sock: Without Cue Memory Recall Blue: Without Cue Memory Recall Bed: Without Cue Awareness: Appears intact Problem Solving: Appears intact Safety/Judgment: Appears intact Sensation Sensation Light Touch: Appears Intact Hot/Cold: Appears Intact Proprioception: Appears Intact Stereognosis: Not tested Coordination Gross Motor Movements are Fluid and Coordinated: Yes Fine Motor Movements are Fluid and Coordinated: Yes Motor  Motor Motor: Within Functional Limits Motor - Discharge  Observations: [facility]  Mobility Bed Mobility Bed Mobility: Rolling Right;Sit to Supine;Rolling Left;Supine to Sit Rolling Right: Independent with assistive  device Rolling Left: Independent with assistive device Supine to Sit: Independent with assistive device Sit to Supine: Independent with assistive device Transfers Transfers: Sit to Stand;Stand Pivot Transfers;Stand to Sit Sit to Stand: Independent with assistive device Stand to Sit: Independent with assistive device Stand Pivot Transfers: Independent with assistive device Transfer (Assistive device): Rolling walker Locomotion  Gait Ambulation: Yes Gait Assistance: Supervision/Verbal cueing Gait Distance (Feet): 200 Feet Assistive device: None Gait Assistance Details: Verbal cues for precautions/safety Gait Gait: Yes Gait Pattern: Within Functional Limits Gait velocity: decreased Stairs / Additional Locomotion Stairs: Yes Stairs Assistance: Supervision/Verbal cueing Stair Management Technique: Two rails;Step to pattern Number of Stairs: 12 Height of Stairs: 6 Ramp: Supervision/Verbal cueing Curb: Supervision/Verbal cueing Wheelchair Mobility Wheelchair Mobility: No  Trunk/Postural Assessment  Cervical Assessment Cervical Assessment: Exceptions to WFL(CTO brace; cervical precautions) Thoracic Assessment Thoracic Assessment: Exceptions to WFL(CTO brace; back precautions) Lumbar Assessment Lumbar Assessment: Within Functional Limits Postural Control Postural Control: Within Functional Limits  Balance Balance Balance Assessed: Yes Standardized Balance Assessment Standardized Balance Assessment: Berg Balance Test Berg Balance Test Sit to Stand: Able to stand without using hands and stabilize independently Standing Unsupported: Able to stand safely 2 minutes Sitting with Back Unsupported but Feet Supported on Floor or Stool: Able to sit safely and securely 2 minutes Stand to Sit: Sits safely with minimal use of  hands Transfers: Able to transfer safely, minor use of hands Standing Unsupported with Eyes Closed: Able to stand 10 seconds safely Standing Ubsupported with Feet Together: Able to place feet together independently and stand 1 minute safely From Standing, Reach Forward with Outstretched Arm: Reaches forward but needs supervision From Standing Position, Pick up Object from Floor: Able to pick up shoe, needs supervision From Standing Position, Turn to Look Behind Over each Shoulder: Turn sideways only but maintains balance Turn 360 Degrees: Able to turn 360 degrees safely in 4 seconds or less Standing Unsupported, Alternately Place Feet on Step/Stool: Able to stand independently and complete 8 steps >20 seconds Standing Unsupported, One Foot in Front: Able to place foot tandem independently and hold 30 seconds Standing on One Leg: Able to lift leg independently and hold 5-10 seconds Total Score: 48 Static Sitting Balance Static Sitting - Balance Support: Feet supported Static Sitting - Level of Assistance: 7: Independent Dynamic Sitting Balance Dynamic Sitting - Balance Support: Feet supported;During functional activity Dynamic Sitting - Level of Assistance: 7: Independent Static Standing Balance Static Standing - Balance Support: During functional activity Static Standing - Level of Assistance: 6: Modified independent (Device/Increase time) Dynamic Standing Balance Dynamic Standing - Balance Support: No upper extremity supported Dynamic Standing - Level of Assistance: 6: Modified independent (Device/Increase time) Extremity Assessment  RUE Assessment RUE Assessment: Within Functional Limits LUE Assessment LUE Assessment: Within Functional Limits RLE Assessment RLE Assessment: Within Functional Limits General Strength Comments: 5/5 grossly LLE Assessment LLE Assessment: Within Functional Limits General Strength Comments: 5/5 grossly     Excell Seltzer, PT, DPT 07/29/2019, 3:46  PM

## 2019-07-29 NOTE — Discharge Instructions (Signed)
Inpatient Rehab Discharge Instructions  Charles Perez Discharge date and time: No discharge date for patient encounter.   Activities/Precautions/ Functional Status: Activity: activity as tolerated and Cervical thoracic brace as directed Diet: diabetic diet Wound Care: keep wound clean and dry Functional status:  ___ No restrictions     ___ Walk up steps independently ___ 24/7 supervision/assistance   ___ Walk up steps with assistance ___ Intermittent supervision/assistance  ___ Bathe/dress independently ___ Walk with walker     _x__ Bathe/dress with assistance ___ Walk Independently    ___ Shower independently ___ Walk with assistance    ___ Shower with assistance ___ No alcohol     ___ Return to work/school ________  COMMUNITY REFERRALS UPON DISCHARGE:    No Outpatient/Home Health follow up required per therapy.   Medical Equipment/Items Ordered: Agricultural consultant                                                 Agency/Supplier: [employer] [phone]    Special Instructions: MATCH discount card provided for medications  No driving smoking or alcohol  My questions have been answered and I understand these instructions. I will adhere to these goals and the provided educational materials after my discharge from the hospital.  Patient/Caregiver Signature _______________________________ Date __________  Clinician Signature _______________________________________ Date __________  Please bring this form and your medication list with you to all your follow-up doctor's appointments.

## 2019-07-29 NOTE — Plan of Care (Signed)
  Problem: Consults Goal: RH GENERAL PATIENT EDUCATION Description: See Patient Education module for education specifics. Outcome: Progressing Goal: Skin Care Protocol Initiated - if Braden Score 18 or less Description: If consults are not indicated, leave blank or document N/A Outcome: Progressing Goal: Nutrition Consult-if indicated Outcome: Progressing Goal: Diabetes Guidelines if Diabetic/Glucose > 140 Description: If diabetic or lab glucose is > 140 mg/dl - Initiate Diabetes/Hyperglycemia Guidelines & Document Interventions  Outcome: Progressing   Problem: RH BOWEL ELIMINATION Goal: RH STG MANAGE BOWEL WITH ASSISTANCE Description: STG Manage Bowel with stand by  (minimal) Assistance. Outcome: Progressing Goal: RH STG MANAGE BOWEL W/MEDICATION W/ASSISTANCE Description: STG Manage Bowel with Medication with minimal Assistance. Outcome: Progressing   Problem: RH BLADDER ELIMINATION Goal: RH STG MANAGE BLADDER WITH ASSISTANCE Description: STG Manage Bladder With  mod I  Outcome: Progressing Goal: RH STG MANAGE BLADDER WITH MEDICATION WITH ASSISTANCE Description: STG Manage Bladder With Medication With Assistance. N/A Outcome: Progressing Goal: RH STG MANAGE BLADDER WITH EQUIPMENT WITH ASSISTANCE Description: STG Manage Bladder With Equipment With mod I Outcome: Progressing   Problem: RH SKIN INTEGRITY Goal: RH STG SKIN FREE OF INFECTION/BREAKDOWN Outcome: Progressing Goal: RH STG MAINTAIN SKIN INTEGRITY WITH ASSISTANCE Description: STG Maintain Skin Integrity With Mod I Outcome: Progressing Goal: RH STG ABLE TO PERFORM INCISION/WOUND CARE W/ASSISTANCE Description: STG Able To Perform Incision/Wound Care With minimal Assistance. Outcome: Progressing   Problem: RH SAFETY Goal: RH STG ADHERE TO SAFETY PRECAUTIONS W/ASSISTANCE/DEVICE Description: STG Adhere to Safety Precautions With minimal Assistance/Device. Outcome: Progressing Goal: RH STG DECREASED RISK OF FALL WITH  ASSISTANCE Description: STG Decreased Risk of Fall With Assistance. Outcome: Progressing   Problem: RH PAIN MANAGEMENT Goal: RH STG PAIN MANAGED AT OR BELOW PT'S PAIN GOAL Outcome: Progressing   Problem: RH KNOWLEDGE DEFICIT GENERAL Goal: RH STG INCREASE KNOWLEDGE OF SELF CARE AFTER HOSPITALIZATION Outcome: Progressing   

## 2019-07-29 NOTE — Progress Notes (Signed)
Occupational Therapy Session Note  Patient Details  Name: Charles Perez MRN: [id number] Date of Birth: 09/08/1985  Today's Date: 07/29/2019 OT Individual Time: 0737-1062 OT Individual Time Calculation (min): 55 min    Short Term Goals: Week 1:  OT Short Term Goal 1 (Week 1): STGS equal to LTGs set at supervision to modified independent.  Skilled Therapeutic Interventions/Progress Updates:     Pt resting in bed upon arrival with interpreter present.  Pt declined bathing/dressing this morning but requested to brush teeth. Pt requires assistance donning back portion of CTO but is able to don front portion and adjust appropriately. Pt amb without AD to sink and stood at sink to complete grooming tasks.  Pt amb without AD to tub room and practiced stepping over into tub. Pt amb with AD to day room with RW and returned to room without AD.  Pt with no LOB during session.  Pt doffs CTO without assistance. Reviewed home safety recommendations.  Reviewed back precautions and CTO use. Pt remained in bed with breakfast tray on bedside table over bed. Bed alarm activated. All needs within reach.  Therapy Documentation Precautions:  Precautions Precautions: Fall, Cervical, Back Precaution Comments: brace must be donned in supine Required Braces or Orthoses: Cervical Brace, Spinal Brace Cervical Brace: Hard collar, At all times Spinal Brace: Other (comment) Spinal Brace Comments: Brace can be removed in bed Restrictions Weight Bearing Restrictions: Yes  Pain: Pain Assessment Pain Scale: 0-10 Pain Score: 7  Pain Type: Surgical pain Pain Location: Neck Pain Orientation: Posterior Pain Descriptors / Indicators: Aching Pain Frequency: Constant Pain Intervention(s): repositioned   Therapy/Group: Individual Therapy  Rich Brave 07/29/2019, 8:55 AM

## 2019-07-29 NOTE — Progress Notes (Signed)
[place] PHYSICAL MEDICINE & REHABILITATION PROGRESS NOTE   Subjective/Complaints:  Pt's GF said pt wasn't taking meds at home for DM- explained needs to take at home.   Pt reports "having a lot of pain"- injections more helpful yesterday- explained needs to drink a lot of water to make trigger point injections last longer- next few days minimum- usually lasts 4 weeks or so.     ROS:   Pt denies SOB, abd pain, CP, N/V/C/D, and vision changes   Objective:   No results found. No results for input(s): WBC, HGB, HCT, PLT in the last 72 hours. No results for input(s): NA, K, CL, CO2, GLUCOSE, BUN, CREATININE, CALCIUM in the last 72 hours.  Intake/Output Summary (Last 24 hours) at 07/29/2019 1010 Last data filed at 07/29/2019 0830 Gross per 24 hour  Intake 800 ml  Output 750 ml  Net 50 ml     Physical Exam: Vital Signs Blood pressure 129/84, pulse 87, temperature 97.6 F (36.4 C), resp. rate 18, height 5\' 9"  (1.753 m), weight 96.9 kg, SpO2 99 %. Gen: awake, alert, appropriate, NAD HEENT: not wearing collar- steristrips on incision CV: RRR; no JVD Pulm: CTA B/L- good air movement GI- soft, NT, ND, (+)BS Skin: Warm and dry.  Intact. Psych: \sleepy Musc: less TTP posterior neck muscles Neuro: alert, but vague; doesn't answer without a lot of questions Bilateral upper extremities: 4+/5 proximal distal, unchanged Bilateral lower extremities: 4+/5 proximal distal, unchanged  Assessment/Plan: 1. Functional deficits secondary to Hangman's fracture and resulting SCI  which require 3+ hours per day of interdisciplinary therapy in a comprehensive inpatient rehab setting.  Physiatrist is providing close team supervision and 24 hour management of active medical problems listed below.  Physiatrist and rehab team continue to assess barriers to discharge/monitor patient progress toward functional and medical goals  Care Tool:  Bathing    Body parts bathed by patient: Right arm, Left  arm, Chest, Abdomen, Front perineal area, Right upper leg, Left upper leg, Right lower leg, Left lower leg, Face, Buttocks   Body parts bathed by helper: Right lower leg, Left lower leg, Buttocks     Bathing assist Assist Level: Independent with assistive device     Upper Body Dressing/Undressing Upper body dressing Upper body dressing/undressing activity did not occur (including orthotics): Safety/medical concerns What is the patient wearing?: Pull over shirt, Orthosis    Upper body assist Assist Level: Independent with assistive device    Lower Body Dressing/Undressing Lower body dressing      What is the patient wearing?: Pants     Lower body assist Assist for lower body dressing: Independent with assitive device     Toileting Toileting    Toileting assist Assist for toileting: Independent with assistive device     Transfers Chair/bed transfer  Transfers assist     Chair/bed transfer assist level: Supervision/Verbal cueing     Locomotion Ambulation   Ambulation assist      Assist level: Contact Guard/Touching assist Assistive device: No Device Max distance: 200'   Walk 10 feet activity   Assist     Assist level: Contact Guard/Touching assist Assistive device: No Device   Walk 50 feet activity   Assist Walk 50 feet with 2 turns activity did not occur: Safety/medical concerns  Assist level: Contact Guard/Touching assist Assistive device: No Device    Walk 150 feet activity   Assist Walk 150 feet activity did not occur: Safety/medical concerns  Assist level: Contact Guard/Touching assist Assistive device:  No Device    Walk 10 feet on uneven surface  activity   Assist Walk 10 feet on uneven surfaces activity did not occur: Refused(Per report )         Wheelchair     Assist Will patient use wheelchair at discharge?: Yes Type of Wheelchair: Manual Wheelchair activity did not occur: Refused         Wheelchair 50 feet  with 2 turns activity    Assist    Wheelchair 50 feet with 2 turns activity did not occur: Refused       Wheelchair 150 feet activity     Assist  Wheelchair 150 feet activity did not occur: Refused       Blood pressure 129/84, pulse 87, temperature 97.6 F (36.4 C), resp. rate 18, height 5\' 9"  (1.753 m), weight 96.9 kg, SpO2 99 %.  Medical Problem List and Plan: 1.Decreased functional mobilitysecondary to SCI/hangman's fractureT1 fracture dislocation T3 compression deformity/status post C6-T4 pedicle screw fixation 07/17/2019.CTOcollar Patient maynot shower- has to keep Cervical collar and thoracic extension in place at all times, reminded patient. `` 3/30- will order honeycomb dressing to be removed- dry dressing  4/2- steristrips on incision- looks good  Continue CIR  2. Antithrombotics: -DVT/anticoagulation:Lovenox  Dopplers pending  3/31- Doppler's (-) -antiplatelet therapy: N/A 3. Pain Management:Oxycodone  Changed oxy to 5-10 mg q3 hours prn since pt was supposedly confused today  Pain appears to be predominantly muscular-Robaxin scheduled on 3/28  3/29- changed meds to 5-15 mg q3 hours prn- will see if can tolerate due to sedation/confusion over weekend- also will suggest TrP injections tomorrow  3/30- pt reports pain a little better  3/31- will do trigger point injections tomorrow  4/1- got consent- did trigger point injections into B/L levators, splenius capitus and rhomboids- not near incision after cleaning with alcohol- pt rated pain 4/10- afterwards 3/10 after laid down- ROM objectively looked better and less strain seen on face- upset since doesn't like needles.  4/2- says worked yesterday but not as much today- explained to drink water 4. Mood:Provide emotional support -antipsychotic agents: N/A 5. Neuropsych: This patientiscapable of making decisions on hisown behalf. 6. Skin/Wound Care:Routine skin  checks 7. Fluids/Electrolytes/Nutrition:Routine in and outs 8. Hyperglycemia, likely DM. Noted history of diabetes mellitus on no medications at admission.   Hemoglobin A1c ordered for tomorrow  Metformin started on 3/26, increased on 3/28  Elevated on 3/28   CBG (last 3)  Recent Labs    07/28/19 1702 07/28/19 2119 07/29/19 0557  GLUCAP 169* 181* 160*   3/31- BGs overall doing well- con't metformin- A1c 9.3- Metformin 1000 mg BID- will add Glipizide 2.5 mg daily and monitor  4/1- BGs 135-190- slightly improved with glipizide- still also getting SSI- is upset about that  4/2- will allow PCP to titrate GLipizide 9. Tobacco abuse. Counseling 10. Tachycardia- mild  11. Hypoxia-   Improving 12. Neurogenic bowel and bladder?  PVRs order x3, pending on 3/28, however appears to be voiding without difficulty  No documented bowel movement, meds increased on 3/28, continue further adjustments as necessary  13. Hyponatremia  Sodium 128 on 3/26  Labs ordered for tomorrow  3/29- Na 129- slightly improved 14.  Leukocytosis  WBCs 12.2 on 3/26  Labs ordered for tomorrpw  3/29- WBC down to 9.3k- will monitor 15.  Acute blood loss anemia  Hemoglobin 11.3 on 3/26  Labs ordered for tomorrow  3/29- Hb 11.3 16. Insomnia  3/30- will increase trazodone  3/31- slept  a little better- pain still an issue  4/1- said sleep hard due to pain- but doesn't want to change meds 17. Dispo  4/2- d/c tomorrow- needs f/u with Dr Berline Chough and surgeon   LOS: 8 days A FACE TO FACE EVALUATION WAS PERFORMED  Rosely Fernandez 07/29/2019, 10:10 AM

## 2019-07-29 NOTE — Progress Notes (Signed)
RN discussed discharge instructions and given to patient and his girlfriend. Medication program was explained and given to patient/girlfriend via interpreter. No further questions/concerns.

## 2019-07-29 NOTE — Progress Notes (Signed)
Occupational Therapy Session Note  Patient Details  Name: Charles Perez MRN: [id number] Date of Birth: 1986/03/01  Today's Date: 07/29/2019 OT Individual Time: 1000-1055 OT Individual Time Calculation (min): 55 min    Short Term Goals: Week 1:  OT Short Term Goal 1 (Week 1): STGS equal to LTGs set at supervision to modified independent.  Skilled Therapeutic Interventions/Progress Updates:    Pt resting in bed with interpreter present.  Pt agreeable to therapy. Pt required assistance donning back portin of CTO. Pt amb with RW to tub room and c/o increased pain in R chest area. Pt amb without AD. Pt returned to room and requested to lay back into bed to remove CTO. Discussed CTO wearing orders from MD. Discussed home safety. Discussed use of RW at home and community. Pt familiar with changing pads on CTO.  Pt remained in bed with all needs within reach and bed alarm activated.   Therapy Documentation Precautions:  Precautions Precautions: Fall, Cervical, Back Precaution Comments: brace must be donned in supine Required Braces or Orthoses: Cervical Brace, Spinal Brace Cervical Brace: Hard collar, At all times Spinal Brace: Other (comment) Spinal Brace Comments: Brace can be removed in bed Restrictions Weight Bearing Restrictions: Yes   Pain:  Pt c/o discomfort (7/10) in chest/sternum when amb and CTO in place; pain dissipates when sitting with CTO in place; PA aware and addressed with pt.   Therapy/Group: Individual Therapy  Rich Brave 07/29/2019, 11:01 AM

## 2019-07-30 LAB — GLUCOSE, CAPILLARY: Glucose-Capillary: 160 mg/dL — ABNORMAL HIGH (ref 70–99)

## 2019-07-30 NOTE — Progress Notes (Signed)
Pt discharged via wheelchair with girlfriend and other male visitor. Pt has diabetes notebook and discharge packet. Aware to pick up medications from [facility]. All questions answered.

## 2019-07-30 NOTE — Progress Notes (Signed)
[facility] PHYSICAL MEDICINE & REHABILITATION PROGRESS NOTE   Subjective/Complaints:  No c/o. Doesn't offer much   ROS:   Limited engagement   Objective:   No results found. No results for input(s): WBC, HGB, HCT, PLT in the last 72 hours. No results for input(s): NA, K, CL, CO2, GLUCOSE, BUN, CREATININE, CALCIUM in the last 72 hours.  Intake/Output Summary (Last 24 hours) at 07/30/2019 0907 Last data filed at 07/29/2019 1832 Gross per 24 hour  Intake 480 ml  Output 300 ml  Net 180 ml     Physical Exam: Vital Signs Blood pressure 135/81, pulse 90, temperature 99.3 F (37.4 C), temperature source Oral, resp. rate 20, height 5\' 9"  (1.753 m), weight 96.9 kg, SpO2 98 %. Constitutional: No distress . Vital signs reviewed. HEENT: EOMI, oral membranes moist Neck: supple Cardiovascular: RRR without murmur. No JVD    Respiratory/Chest: CTA Bilaterally without wheezes or rales. Normal effort    GI/Abdomen: BS +, non-tender, non-distended Ext: no clubbing, cyanosis, or edema Psych: flat and disengaged Musc: less TTP posterior neck muscles Neuro: alert, but vague; doesn't answer without a lot of questions Bilateral upper extremities: 4+/5 proximal distal, unchanged Bilateral lower extremities: 4+/5 proximal distal, unchanged  Assessment/Plan: 1. Functional deficits secondary to Hangman's fracture and resulting SCI  which require 3+ hours per day of interdisciplinary therapy in a comprehensive inpatient rehab setting.  Physiatrist is providing close team supervision and 24 hour management of active medical problems listed below.  Physiatrist and rehab team continue to assess barriers to discharge/monitor patient progress toward functional and medical goals  Care Tool:  Bathing    Body parts bathed by patient: Right arm, Left arm, Chest, Abdomen, Front perineal area, Right upper leg, Left upper leg, Right lower leg, Left lower leg, Face, Buttocks   Body parts bathed by helper:  Right lower leg, Left lower leg, Buttocks     Bathing assist Assist Level: Independent with assistive device     Upper Body Dressing/Undressing Upper body dressing Upper body dressing/undressing activity did not occur (including orthotics): Safety/medical concerns What is the patient wearing?: Pull over shirt, Orthosis    Upper body assist Assist Level: Minimal Assistance - Patient > 75%    Lower Body Dressing/Undressing Lower body dressing      What is the patient wearing?: Pants     Lower body assist Assist for lower body dressing: Independent with assitive device     Toileting Toileting    Toileting assist Assist for toileting: Independent with assistive device     Transfers Chair/bed transfer  Transfers assist     Chair/bed transfer assist level: Independent with assistive device Chair/bed transfer assistive device: (increased time)   Locomotion Ambulation   Ambulation assist      Assist level: Supervision/Verbal cueing Assistive device: No Device Max distance: 200'   Walk 10 feet activity   Assist     Assist level: Supervision/Verbal cueing Assistive device: No Device   Walk 50 feet activity   Assist Walk 50 feet with 2 turns activity did not occur: Safety/medical concerns  Assist level: Supervision/Verbal cueing Assistive device: No Device    Walk 150 feet activity   Assist Walk 150 feet activity did not occur: Safety/medical concerns  Assist level: Supervision/Verbal cueing Assistive device: No Device    Walk 10 feet on uneven surface  activity   Assist Walk 10 feet on uneven surfaces activity did not occur: Refused(Per report )   Assist level: Supervision/Verbal cueing  Wheelchair     Assist Will patient use wheelchair at discharge?: No Type of Wheelchair: Manual Wheelchair activity did not occur: Refused         Wheelchair 50 feet with 2 turns activity    Assist    Wheelchair 50 feet with 2 turns  activity did not occur: Refused       Wheelchair 150 feet activity     Assist  Wheelchair 150 feet activity did not occur: Refused       Blood pressure 135/81, pulse 90, temperature 99.3 F (37.4 C), temperature source Oral, resp. rate 20, height 5\' 9"  (1.753 m), weight 96.9 kg, SpO2 98 %.  Medical Problem List and Plan: 1.Decreased functional mobilitysecondary to SCI/hangman's fractureT1 fracture dislocation T3 compression deformity/status post C6-T4 pedicle screw fixation 07/17/2019.CTOcollar Patient maynot shower- has to keep Cervical collar and thoracic extension in place at all times, reminded patient. `` 3/30- will order honeycomb dressing to be removed- dry dressing  4/2- steristrips on incision- looks good  -dc home today. outpt f/u    2. Antithrombotics: -DVT/anticoagulation:Lovenox     3/31- Doppler's (-) -antiplatelet therapy: N/A 3. Pain Management:Oxycodone  Changed oxy to 5-10 mg q3 hours prn since pt was supposedly confused today  Pain appears to be predominantly muscular-Robaxin scheduled on 3/28  3/29- changed meds to 5-15 mg q3 hours prn- will see if can tolerate due to sedation/confusion over weekend- also will suggest TrP injections tomorrow  3/30- pt reports pain a little better  3/31- will do trigger point injections tomorrow  4/1- got consent- did trigger point injections into B/L levators, splenius capitus and rhomboids- not near incision after cleaning with alcohol- pt rated pain 4/10- afterwards 3/10 after laid down- ROM objectively looked better and less strain seen on face- upset since doesn't like needles.  4/2- says worked yesterday but not as much today- explained to drink water 4. Mood:Provide emotional support -antipsychotic agents: N/A 5. Neuropsych: This patientiscapable of making decisions on hisown behalf. 6. Skin/Wound Care:Routine skin checks 7. Fluids/Electrolytes/Nutrition:Routine in  and outs 8. Hyperglycemia, likely DM. Noted history of diabetes mellitus on no medications at admission.   Hemoglobin A1c ordered for tomorrow  Metformin started on 3/26, increased on 3/28  Elevated on 3/28   CBG (last 3)  Recent Labs    07/29/19 1703 07/29/19 2111 07/30/19 0621  GLUCAP 140* 143* 160*   3/31- BGs overall doing well- con't metformin- A1c 9.3- Metformin 1000 mg BID- will add Glipizide 2.5 mg daily and monitor  4/1- BGs 135-190- slightly improved with glipizide- still also getting SSI- is upset about that  4/2- will allow PCP to titrate GLipizide 9. Tobacco abuse. Counseling 10. Tachycardia- mild  11. Hypoxia-   Improving 12. Neurogenic bowel and bladder?  PVRs order x3, pending on 3/28, however appears to be voiding without difficulty  No documented bowel movement, meds increased on 3/28, continue further adjustments as necessary  13. Hyponatremia  Sodium 128 on 3/26  Labs ordered for tomorrow  3/29- Na 129- slightly improved 14.  Leukocytosis  WBCs 12.2 on 3/26  Labs ordered for tomorrpw  3/29- WBC down to 9.3k- will monitor 15.  Acute blood loss anemia  Hemoglobin 11.3 on 3/26  Labs ordered for tomorrow  3/29- Hb 11.3 16. Insomnia  3/30- will increase trazodone  3/31- slept a little better- pain still an issue  4/1- said sleep hard due to pain- but doesn't want to change meds 17. Dispo  4/2- d/c today needs f/u  with Dr Dagoberto Ligas and surgeon   LOS: 9 days A FACE TO [place] 07/30/2019, 9:07 AM

## 2019-07-30 NOTE — Plan of Care (Signed)
  Problem: Consults Goal: RH GENERAL PATIENT EDUCATION Description: See Patient Education module for education specifics. Outcome: Completed/Met Goal: Skin Care Protocol Initiated - if Braden Score 18 or less Description: If consults are not indicated, leave blank or document N/A Outcome: Completed/Met Goal: Nutrition Consult-if indicated Outcome: Completed/Met Goal: Diabetes Guidelines if Diabetic/Glucose > 140 Description: If diabetic or lab glucose is > 140 mg/dl - Initiate Diabetes/Hyperglycemia Guidelines & Document Interventions  Outcome: Completed/Met   Problem: RH BOWEL ELIMINATION Goal: RH STG MANAGE BOWEL WITH ASSISTANCE Description: STG Manage Bowel with stand by  (minimal) Assistance. Outcome: Completed/Met Goal: RH STG MANAGE BOWEL W/MEDICATION W/ASSISTANCE Description: STG Manage Bowel with Medication with minimal Assistance. Outcome: Completed/Met   Problem: RH BLADDER ELIMINATION Goal: RH STG MANAGE BLADDER WITH ASSISTANCE Description: STG Manage Bladder With  mod I  Outcome: Completed/Met Goal: RH STG MANAGE BLADDER WITH MEDICATION WITH ASSISTANCE Description: STG Manage Bladder With Medication With Assistance. N/A Outcome: Completed/Met Goal: RH STG MANAGE BLADDER WITH EQUIPMENT WITH ASSISTANCE Description: STG Manage Bladder With Equipment With mod I Outcome: Completed/Met   Problem: RH SKIN INTEGRITY Goal: RH STG SKIN FREE OF INFECTION/BREAKDOWN Outcome: Completed/Met Goal: RH STG MAINTAIN SKIN INTEGRITY WITH ASSISTANCE Description: STG Maintain Skin Integrity With Mod I Outcome: Completed/Met Goal: RH STG ABLE TO PERFORM INCISION/WOUND CARE W/ASSISTANCE Description: STG Able To Perform Incision/Wound Care With minimal Assistance. Outcome: Completed/Met   Problem: RH SAFETY Goal: RH STG ADHERE TO SAFETY PRECAUTIONS W/ASSISTANCE/DEVICE Description: STG Adhere to Safety Precautions With minimal Assistance/Device. Outcome: Completed/Met Goal: RH STG  DECREASED RISK OF FALL WITH ASSISTANCE Description: STG Decreased Risk of Fall With Assistance. Outcome: Completed/Met   Problem: RH PAIN MANAGEMENT Goal: RH STG PAIN MANAGED AT OR BELOW PT'S PAIN GOAL Outcome: Completed/Met   Problem: RH KNOWLEDGE DEFICIT GENERAL Goal: RH STG INCREASE KNOWLEDGE OF SELF CARE AFTER HOSPITALIZATION Outcome: Completed/Met

## 2019-08-02 ENCOUNTER — Telehealth: Payer: Self-pay | Admitting: *Deleted

## 2019-08-02 NOTE — Telephone Encounter (Signed)
Transitional care call, 1st attempt  I contacted the number provided on the social work discharge note, [phone].  Went straight to message indicating that the person on the other end of this phone is currently not taking calls

## 2019-08-03 NOTE — Telephone Encounter (Signed)
Transitional care call 2nd attempt  Contacted Edrick Whitehorn [phone]  Left voicemail to contact our clinic

## 2019-08-04 NOTE — Telephone Encounter (Signed)
Transitional Care call--Charles Perez    1. Are you/is patient experiencing any problems since coming home? Back pain, running out of pain meds, told to call pharmacy before running out so they can send a refill request Are there any questions regarding any aspect of care? No 2. Are there any questions regarding medications administration/dosing? No Are meds being taken as prescribed? Yes Patient should review meds with caller to confirm 3. Have there been any falls? No 4. Has Home Health been to the house and/or have they contacted you? No therapy ordered If not, have you tried to contact them? Can we help you contact them? 5. Are bowels and bladder emptying properly? Yes Are there any unexpected incontinence issues? No If applicable, is patient following bowel/bladder programs? 6. Any fevers, problems with breathing, unexpected pain? No 7. Are there any skin problems or new areas of breakdown? No 8. Has the patient/family member arranged specialty MD follow up (ie cardiology/neurology/renal/surgical/etc)? Yes Can we help arrange? 9. Does the patient need any other services or support that we can help arrange? No 10. Are caregivers following through as expected in assisting the patient? Yes 11. Has the patient quit smoking, drinking alcohol, or using drugs as recommended? Yes  Appointment time 10:20 am , arrive time 10:00 am with Dr. Berline Chough [address] [phone]

## 2019-08-10 ENCOUNTER — Encounter: Payer: Self-pay | Attending: Physical Medicine and Rehabilitation | Admitting: Physical Medicine and Rehabilitation

## 2019-08-10 ENCOUNTER — Other Ambulatory Visit: Payer: Self-pay

## 2019-08-10 ENCOUNTER — Encounter: Payer: Self-pay | Admitting: Physical Medicine and Rehabilitation

## 2019-08-10 VITALS — BP 124/83 | HR 90 | Temp 97.2°F | Ht 70.0 in | Wt 205.8 lb

## 2019-08-10 DIAGNOSIS — G894 Chronic pain syndrome: Secondary | ICD-10-CM | POA: Insufficient documentation

## 2019-08-10 DIAGNOSIS — Z5181 Encounter for therapeutic drug level monitoring: Secondary | ICD-10-CM | POA: Insufficient documentation

## 2019-08-10 DIAGNOSIS — E119 Type 2 diabetes mellitus without complications: Secondary | ICD-10-CM | POA: Insufficient documentation

## 2019-08-10 DIAGNOSIS — G8918 Other acute postprocedural pain: Secondary | ICD-10-CM | POA: Insufficient documentation

## 2019-08-10 DIAGNOSIS — Z79891 Long term (current) use of opiate analgesic: Secondary | ICD-10-CM | POA: Insufficient documentation

## 2019-08-10 DIAGNOSIS — G8250 Quadriplegia, unspecified: Secondary | ICD-10-CM | POA: Insufficient documentation

## 2019-08-10 MED ORDER — OXYCODONE HCL 5 MG PO TABS: 5.0000 mg | ORAL_TABLET | Freq: Four times a day (QID) | ORAL | 0 refills | Status: DC | PRN

## 2019-08-10 MED ORDER — CYCLOBENZAPRINE HCL 10 MG PO TABS: 10.0000 mg | ORAL_TABLET | Freq: Three times a day (TID) | ORAL | 1 refills | Status: DC | PRN

## 2019-08-10 MED ORDER — GLIPIZIDE 5 MG PO TABS: 2.5000 mg | ORAL_TABLET | Freq: Every day | ORAL | 5 refills | Status: AC

## 2019-08-10 MED ORDER — METFORMIN HCL 1000 MG PO TABS: 1000.0000 mg | ORAL_TABLET | Freq: Two times a day (BID) | ORAL | 5 refills | Status: AC

## 2019-08-10 NOTE — Progress Notes (Signed)
Subjective:    Patient ID: Charles Perez, male    DOB: 05/21/1985, 34 y.o.   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 Doesn't have neurogenic bowel and bladder but having post op pain still.   Needs an appointment with [facility]- they won't call him back- still wearing neck brace with thoracic extension.   Ran out of pain meds- 4 days ago.  Got him some tylenol- gave him tylenol prm- didn't work much.   Pain still bad in neck. Upper back. All the pain in neck/back  Stomach is bothering him- has gastritis and has nausea. Gave him some tums-  Helped some.   Pulsing pain in neck- behind ears.  Thinks screws hurting him- but sounds like it's in the muscles on each side of neck.   Giving Robaxin "faithfully" because prescribed- but not working for muscle pain  Was taking oxycodone 10 mg- was giving to pt every hours.    Having loose stools- always loose, even on pain meds.   Taking glipizide and Metformin.  Correctly and faithfully.      Pain Inventory Average Pain 5 Pain Right Now 5  Gets up to 8 at night My pain is intermittent and throbbing and pressure to the side of incision  In the last 24 hours, has pain interfered with the following? General activity 0 Relation with others 0 Enjoyment of life 0 What TIME of day is your pain at its worst? night Sleep (in general) Poor  Pain is worse with: walking, bending, sitting, inactivity, standing and some activites Pain improves with: rest and medication Relief from Meds: 8  Mobility use a walker  Function I need assistance with the following:  meal prep, household duties and shopping  Neuro/Psych trouble walking spasms  Prior Studies Any changes since last visit?  no  Physicians involved in your care Any changes since last visit?  no   Family History  Problem Relation Age of Onset  . Cancer Mother   . Diabetes Father    Social History    Socioeconomic History  . Marital status: Single    Spouse name: Not on file  . Number of children: Not on file  . Years of education: Not on file  . Highest education level: Not on file  Occupational History  . Not on file  Tobacco Use  . Smoking status: Light Tobacco Smoker    Types: Cigarettes  . Smokeless tobacco: Never Used  . Tobacco comment: one pack per week  Substance and Sexual Activity  . Alcohol use: Yes    Comment: drinks 3 6-packs of beer on the weekends  . Drug use: Never  . Sexual activity: Not on file  Other Topics Concern  . Not on file  Social History Narrative  . Not on file   Social Determinants of Health   Financial Resource Strain:   . Difficulty of Paying Living Expenses:   Food Insecurity:   . Worried About Programme researcher, broadcasting/film/video in the Last Year:   . Barista in the Last Year:   Transportation Needs:   . Freight forwarder (Medical):   Marland Kitchen Lack of Transportation (Non-Medical):   Physical Activity:   . Days of Exercise per Week:   . Minutes of Exercise per Session:   Stress:   . Feeling of Stress :   Social Connections:   . Frequency of Communication with  Friends and Family:   . Frequency of Social Gatherings with Friends and Family:   . Attends Religious Services:   . Active Member of Clubs or Organizations:   . Attends Archivist Meetings:   Marland Kitchen Marital Status:    Past Surgical History:  Procedure Laterality Date  . POSTERIOR CERVICAL FUSION/FORAMINOTOMY N/A 07/17/2019   Procedure: POSTERIOR CERVICAL-THORACIC FUSION, CERVICAL SIX-THORACIC FOUR;  Surgeon: Kary Kos, MD;  Location: [place];  Service: Neurosurgery;  Laterality: N/A;   Past Medical History:  Diagnosis Date  . Back pain due to injury   . Diabetes (HCC)    BP 124/83   Pulse 90   Temp (!) 97.2 F (36.2 C)   Ht 5\' 10"  (1.778 m)   Wt 205 lb 12.8 oz (93.4 kg)   SpO2 98%   BMI 29.53 kg/m   Opioid Risk Score:   Fall Risk Score:  `1  Depression screen PHQ  2/9  Depression screen PHQ 2/9 08/10/2019  Decreased Interest 1  Down, Depressed, Hopeless 0  PHQ - 2 Score 1  Altered sleeping 1  Tired, decreased energy 2  Change in appetite 0  Feeling bad or failure about yourself  0  Trouble concentrating 1  Moving slowly or fidgety/restless 0  Suicidal thoughts 0  PHQ-9 Score 5  Difficult doing work/chores Not difficult at all    Review of Systems  Constitutional: Negative.   HENT: Negative.   Eyes: Negative.   Respiratory: Negative.   Cardiovascular: Negative.   Gastrointestinal:       Sometimes nausea  Endocrine: Negative.   Genitourinary: Negative.   Musculoskeletal: Positive for neck pain.       Shoulders  Skin: Negative.   Allergic/Immunologic: Negative.   Neurological: Negative.   Hematological: Negative.   Psychiatric/Behavioral: Negative.   All other systems reviewed and are negative.      Objective:   Physical Exam  Awake, alert, appropriate, sitttng up in table, wearing Aspen collar and thoracic extension steristrips over entire incision-covering it- sutures/staples out.  MS: 5/5 in deltoids, biceps, triceps, WE, grip and finger abd 5/5 in HF, KE, KF DF and PF B/L       Assessment & Plan:  Patient is 34 yr old male with incomplete quadriplegia- ASIA E-  due to wall falling on him. Doesn't have neurogenic bowel and bladder but having post op pain still. NOW ASIA E!    1. Change Methocarbemol to Cyclobenzaprine 10 mg 3x/day AS NEEDED_ take preferably, 4 days/week not 7 days/week so he won't get used to the medicine.    2. Oxycodone 5 mg q6 hours AS NEEDED- goal is to wean it within 1-2 months- can wean by splitting pill in half or taking less often.   3. Can take shower with brace and change pads to dry pads afterwards.   4. Discussed ways to detangle hair from hospitalization.   5. Look at [facility] at [facility] if possible.   6.  Suggest seeing [facility] at [facility].   7. Refilled Glipizide and Metformin for pt since doesn't have PCP yet.   8. Opiate contract and drug screen today.   9. F/U in 8-12 weeks.   I spent a total of 35 minutes on appointment- more than 30 minutes discussing how to reduce costs and pain control.

## 2019-08-10 NOTE — Patient Instructions (Signed)
Patient is 34 yr old male with incomplete quadriplegia- ASIA E-  due to wall falling on him. Doesn't have neurogenic bowel and bladder but having post op pain still. NOW ASIA E! Has steristrips- NOT sutures/staples- will fall off on their own when showers.     1. Change Methocarbemol to Cyclobenzaprine 10 mg 3x/day AS NEEDED_ take preferably, 4 days/week not 7 days/week so he won't get used to the medicine.    2. Oxycodone 5 mg q6 hours AS NEEDED- goal is to wean it within 1-2 months- can wean by splitting pill in half or taking less often.   3. Can take shower with brace and change pads to dry pads afterwards.   4. Discussed ways to detangle hair from hospitalization.   5. Look at [facility] at [employer] [facility] if possible.   6.  Suggest seeing [facility] at [facility].  7. Refilled Glipizide and Metformin for pt since doesn't have PCP yet.   8. Opiate contract and drug screen today.   9. F/U in 8-12 weeks.

## 2019-08-13 LAB — TOXASSURE SELECT,+ANTIDEPR,UR

## 2019-08-15 ENCOUNTER — Telehealth: Payer: Self-pay | Admitting: *Deleted

## 2019-08-15 NOTE — Telephone Encounter (Signed)
Urine drug screen was negative for controlled medication, which would be appropriate since her reported last dose of oxycodone was 4 days before the test.

## 2019-10-19 ENCOUNTER — Encounter: Payer: Self-pay | Attending: Physical Medicine and Rehabilitation | Admitting: Physical Medicine and Rehabilitation

## 2019-10-19 ENCOUNTER — Telehealth: Payer: Self-pay | Admitting: *Deleted

## 2019-10-19 DIAGNOSIS — Z5181 Encounter for therapeutic drug level monitoring: Secondary | ICD-10-CM | POA: Insufficient documentation

## 2019-10-19 DIAGNOSIS — G8250 Quadriplegia, unspecified: Secondary | ICD-10-CM | POA: Insufficient documentation

## 2019-10-19 DIAGNOSIS — G894 Chronic pain syndrome: Secondary | ICD-10-CM | POA: Insufficient documentation

## 2019-10-19 DIAGNOSIS — Z79891 Long term (current) use of opiate analgesic: Secondary | ICD-10-CM | POA: Insufficient documentation

## 2019-10-19 DIAGNOSIS — G8918 Other acute postprocedural pain: Secondary | ICD-10-CM | POA: Insufficient documentation

## 2019-10-19 DIAGNOSIS — E119 Type 2 diabetes mellitus without complications: Secondary | ICD-10-CM | POA: Insufficient documentation

## 2019-10-19 MED ORDER — OXYCODONE HCL 5 MG PO TABS: 5.0000 mg | ORAL_TABLET | Freq: Four times a day (QID) | ORAL | 0 refills | Status: DC | PRN

## 2019-10-19 MED ORDER — OXYCODONE HCL 5 MG PO TABS: 5.0000 mg | ORAL_TABLET | Freq: Four times a day (QID) | ORAL | 0 refills | Status: AC | PRN

## 2019-10-19 NOTE — Telephone Encounter (Signed)
A male called for a refill on Mr Hass oxycodone on Monday but they did not identify themselves or leave a call back number and the name was hard to understand. The date of birth was heard to be 09/14/1985, so I was unable to locate the patient in Epic.  Mr Menken was on the schedule today for a visit and I recognized the name as what was left on VM but the birth date is actually May 25, 1985. He No Showed his appointment so I am not sure if you want to refill the medication. His last fill by PMP was on 08/10/19 oxycodone 5 mg #120.

## 2019-10-19 NOTE — Telephone Encounter (Signed)
will refill, however pt needs to be seen again before I will refill again- put on Rx.  Oxycodone 5 mg #120

## 2019-10-19 NOTE — Telephone Encounter (Signed)
I sent it in again, to [facility].

## 2019-10-19 NOTE — Telephone Encounter (Signed)
I spoke with Mrs Charles Perez and they did not know he had the appointment today. She will call tomorrow and make the appointment,  Unfortunately the Rx was sent to [facility] in [place] and they are in [place].  I will cancel the Rx @ [facility] and Dr Berline Chough will need to resend the rx to [facility]. I changed the pharmacy selction to [facility].

## 2019-11-14 ENCOUNTER — Encounter: Payer: Self-pay | Attending: Physical Medicine and Rehabilitation | Admitting: Physical Medicine and Rehabilitation

## 2019-11-14 ENCOUNTER — Other Ambulatory Visit: Payer: Self-pay

## 2019-11-14 ENCOUNTER — Encounter: Payer: Self-pay | Admitting: Physical Medicine and Rehabilitation

## 2019-11-14 VITALS — BP 131/95 | HR 90 | Temp 98.5°F | Ht 70.0 in | Wt 222.0 lb

## 2019-11-14 DIAGNOSIS — G894 Chronic pain syndrome: Secondary | ICD-10-CM | POA: Insufficient documentation

## 2019-11-14 DIAGNOSIS — G8250 Quadriplegia, unspecified: Secondary | ICD-10-CM | POA: Insufficient documentation

## 2019-11-14 DIAGNOSIS — E119 Type 2 diabetes mellitus without complications: Secondary | ICD-10-CM | POA: Insufficient documentation

## 2019-11-14 DIAGNOSIS — G8918 Other acute postprocedural pain: Secondary | ICD-10-CM | POA: Insufficient documentation

## 2019-11-14 DIAGNOSIS — Z5181 Encounter for therapeutic drug level monitoring: Secondary | ICD-10-CM | POA: Insufficient documentation

## 2019-11-14 DIAGNOSIS — Z79891 Long term (current) use of opiate analgesic: Secondary | ICD-10-CM | POA: Insufficient documentation

## 2019-11-14 MED ORDER — CYCLOBENZAPRINE HCL 10 MG PO TABS: 10.0000 mg | ORAL_TABLET | Freq: Three times a day (TID) | ORAL | 5 refills | Status: DC | PRN

## 2019-11-14 MED ORDER — HYDROCODONE-ACETAMINOPHEN 5-325 MG PO TABS: 1.0000 | ORAL_TABLET | Freq: Three times a day (TID) | ORAL | 0 refills | Status: AC | PRN

## 2019-11-14 NOTE — Patient Instructions (Signed)
Patient is 34 yr old male with incomplete quadriplegia- ASIA E-  due to wall falling on him. Doesn't have neurogenic bowel and bladder but having post op pain still. NOW ASIA E!   1. Dr Donalee Citrin was neck surgeon- will get number- needs to call to get neck brace with thoracic extension off. CALL HIM  2. Refill Flexeril 10 mg 3x/day as needed for muscle spasms   3. Has refills til 10/21 for Metformin and Glipizide- needs to get Primary physician before then.   4. Will change Oxycodone- to Hydrocodone/tylenol 3x/day as needed when muscle relaxant doesn't work.    5. F/U in 4 months

## 2019-11-14 NOTE — Progress Notes (Signed)
Subjective:    Patient ID: Charles Perez, male    DOB: 13-Sep-1985, 34 y.o.   MRN: [id number]  HPI Patient is 34 yr old male with incomplete quadriplegia- ASIA E-  due to wall falling on him. Doesn't have neurogenic bowel and bladder but having post op pain still. NOW ASIA E!  Still wearing cervical collar with thoracic extension.  Only wears it when goes out of the house- when in house, takes it off.    Constant pain in LUE- ever since couldn't get IV- poking all over his arm-  Thinks from not finding IV- but sounds like nerve pain from neck.   Sometimes will go from pec to wrist.  Is muscular pain- when clenches fist-  Feels it.  Feels actually like tight spasms- not nerve pain.     Has Rx for Robaxin and Flexeril Helps pain/discomfort- takes when really painful.   Takes Oxycodone 2x/day.   Last Rx was 4/14- had 1 RF on Flexeril.    Asked him to get PCP. Didn't go or set up PCP at all.     Pain Inventory Average Pain 6 Pain Right Now 6 My pain is intermittent  In the last 24 hours, has pain interfered with the following? General activity 0 Relation with others 0 Enjoyment of life 0 What TIME of day is your pain at its worst? night Sleep (in general) Good  Pain is worse with: unsure Pain improves with: medication Relief from Meds: 8  Mobility walk without assistance  Function not employed: date last employed . disabled: date disabled .  Neuro/Psych No problems in this area  Prior Studies Any changes since last visit?  no  Physicians involved in your care Any changes since last visit?  no   Family History  Problem Relation Age of Onset  . Cancer Mother   . Diabetes Father    Social History   Socioeconomic History  . Marital status: Single    Spouse name: Not on file  . Number of children: Not on file  . Years of education: Not on file  . Highest education level: Not on file  Occupational History  . Not on file  Tobacco Use  .  Smoking status: Light Tobacco Smoker    Types: Cigarettes  . Smokeless tobacco: Never Used  . Tobacco comment: one pack per week  Substance and Sexual Activity  . Alcohol use: Yes    Comment: drinks 3 6-packs of beer on the weekends  . Drug use: Never  . Sexual activity: Not on file  Other Topics Concern  . Not on file  Social History Narrative  . Not on file   Social Determinants of Health   Financial Resource Strain:   . Difficulty of Paying Living Expenses:   Food Insecurity:   . Worried About Programme researcher, broadcasting/film/video in the Last Year:   . Barista in the Last Year:   Transportation Needs:   . Freight forwarder (Medical):   Marland Kitchen Lack of Transportation (Non-Medical):   Physical Activity:   . Days of Exercise per Week:   . Minutes of Exercise per Session:   Stress:   . Feeling of Stress :   Social Connections:   . Frequency of Communication with Friends and Family:   . Frequency of Social Gatherings with Friends and Family:   . Attends Religious Services:   . Active Member of Clubs or Organizations:   . Attends Banker Meetings:   .  Marital Status:    Past Surgical History:  Procedure Laterality Date  . POSTERIOR CERVICAL FUSION/FORAMINOTOMY N/A 07/17/2019   Procedure: POSTERIOR CERVICAL-THORACIC FUSION, CERVICAL SIX-THORACIC FOUR;  Surgeon: Donalee Citrin, MD;  Location: [facility];  Service: Neurosurgery;  Laterality: N/A;   Past Medical History:  Diagnosis Date  . Back pain due to injury   . Diabetes (HCC)    BP (!) 131/95   Pulse 90   Temp 98.5 F (36.9 C)   Ht 5\' 10"  (1.778 m)   Wt 222 lb (100.7 kg)   SpO2 97%   BMI 31.85 kg/m   Opioid Risk Score:   Fall Risk Score:  `1  Depression screen PHQ 2/9  Depression screen PHQ 2/9 08/10/2019  Decreased Interest 1  Down, Depressed, Hopeless 0  PHQ - 2 Score 1  Altered sleeping 1  Tired, decreased energy 2  Change in appetite 0  Feeling bad or failure about yourself  0  Trouble concentrating 1   Moving slowly or fidgety/restless 0  Suicidal thoughts 0  PHQ-9 Score 5  Difficult doing work/chores Not difficult at all    Review of Systems  Constitutional: Negative.   HENT: Negative.   Eyes: Negative.   Respiratory: Negative.   Cardiovascular: Negative.   Gastrointestinal: Negative.   Endocrine: Negative.   Genitourinary: Negative.   Musculoskeletal: Positive for back pain and neck pain.  Skin: Negative.   Allergic/Immunologic: Negative.   Neurological: Negative.   Hematological: Negative.   Psychiatric/Behavioral: Negative.   All other systems reviewed and are negative.      Objective:   Physical Exam  Awake, alert, appropriate,e wearing neck cervical collar with thoracic extension, accompanied by interpretor, NAD.   Muscles feel tight in L biceps, triceps, and brachioradialis and supinator- worse in flexor/extensors tendons of L forearm than upper arm/tighter.  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 Doesn't have neurogenic bowel and bladder but having post op pain still. NOW ASIA E!   1. Dr 31 was neck surgeon- will get number- needs to call to get neck brace with thoracic extension off. CALL HIM  2. Refill Flexeril 10 mg 3x/day as needed for muscle spasms   3. Has refills til 10/21 for Metformin and Glipizide- needs to get Primary physician before then.   4. Will change Oxycodone- to Hydrocodone/tylenol 3x/day as needed when muscle relaxant doesn't work.    5. F/U in 4 months  I spent a total of 25 minutes on visit- as detailed above.

## 2019-12-07 ENCOUNTER — Other Ambulatory Visit: Payer: Self-pay | Admitting: Physical Medicine and Rehabilitation

## 2019-12-16 ENCOUNTER — Encounter: Payer: Self-pay | Attending: Physical Medicine and Rehabilitation | Admitting: Physical Medicine and Rehabilitation

## 2019-12-16 DIAGNOSIS — Z5181 Encounter for therapeutic drug level monitoring: Secondary | ICD-10-CM | POA: Insufficient documentation

## 2019-12-16 DIAGNOSIS — G8250 Quadriplegia, unspecified: Secondary | ICD-10-CM | POA: Insufficient documentation

## 2019-12-16 DIAGNOSIS — E119 Type 2 diabetes mellitus without complications: Secondary | ICD-10-CM | POA: Insufficient documentation

## 2019-12-16 DIAGNOSIS — G894 Chronic pain syndrome: Secondary | ICD-10-CM | POA: Insufficient documentation

## 2019-12-16 DIAGNOSIS — G8918 Other acute postprocedural pain: Secondary | ICD-10-CM | POA: Insufficient documentation

## 2019-12-16 DIAGNOSIS — Z79891 Long term (current) use of opiate analgesic: Secondary | ICD-10-CM | POA: Insufficient documentation

## 2021-07-23 IMAGING — MR MR THORACIC SPINE W/O CM
6 of 14 series · 18 of 48 positions shown · non-contrast
Comparison: Correlation made with CTA earlier same day

CLINICAL DATA: Spine fracture

EXAM:
MRI CERVICAL AND THORACIC SPINE WITHOUT CONTRAST
TECHNIQUE: Multiplanar and multiecho pulse sequences of the cervical spine, to
include the craniocervical junction and cervicothoracic junction,
and the thoracic spine, were obtained without intravenous contrast.

[Series 3: T2 · sagittal · 3.0mm · 0.41mm/px · 3 of 17 slices shown (1 of 5)]
[im 1/17]
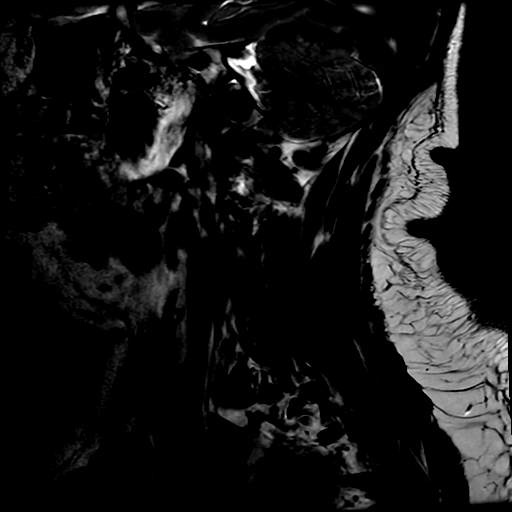
[im 9/17]
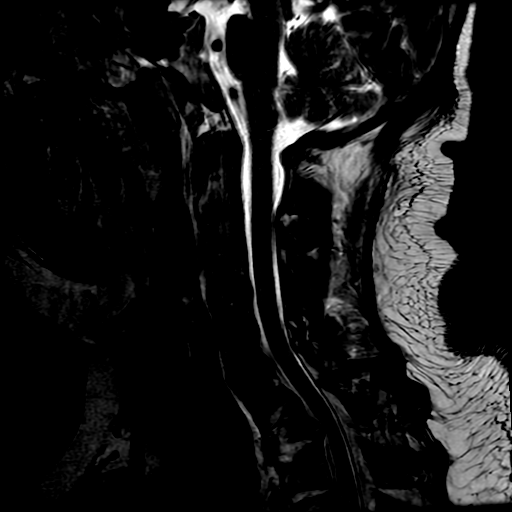
[im 17/17]
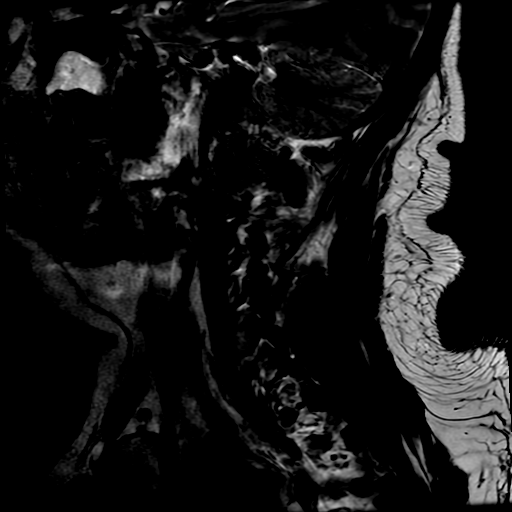

[Series 7: T1 · sagittal · 3.0mm · 0.90mm/px · 2 of 12 slices shown]
[im 1/12]
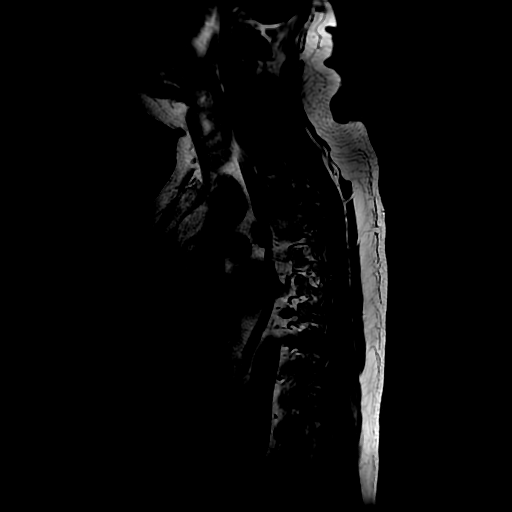
[im 12/12]
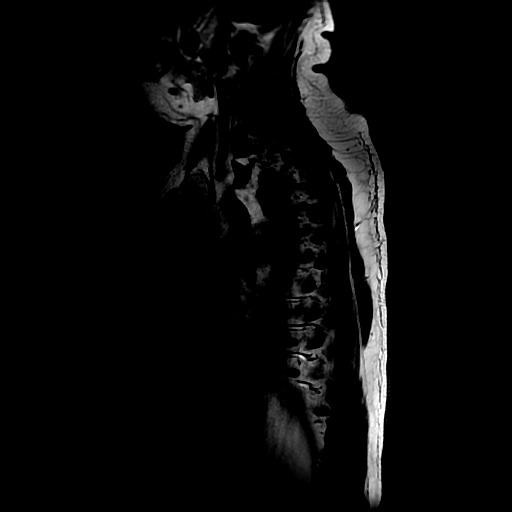

[Series 9: T2 · axial · 3.0mm · 0.35mm/px · z∈[-156,-40]mm · 4 of 36 slices shown (2 of 5)]
[im 1/36]
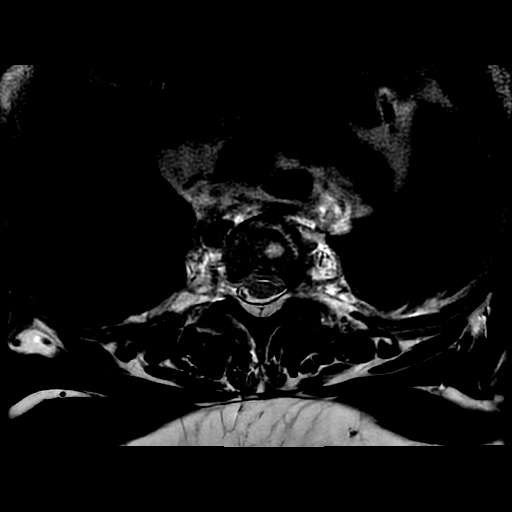
[im 12/36]
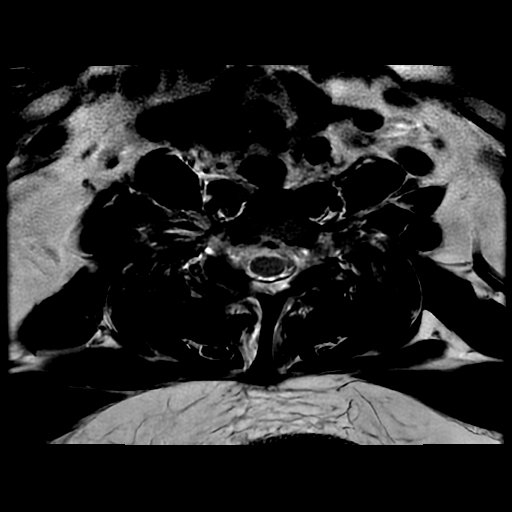
[im 24/36]
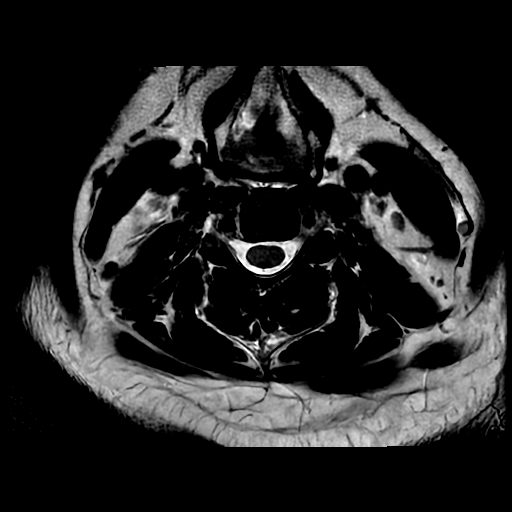
[im 36/36]
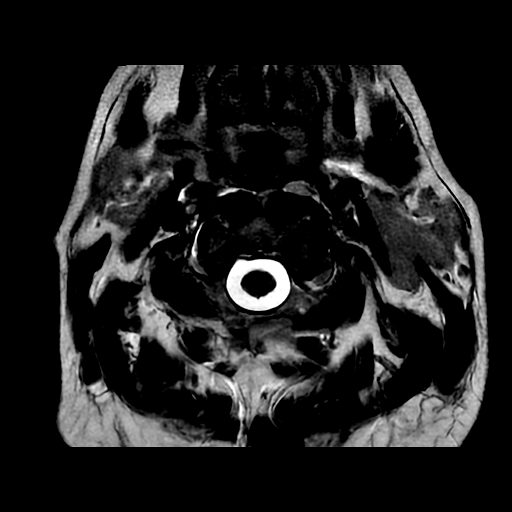

[Series 10: T2 · sagittal · 3.0mm · 0.68mm/px · 2 of 17 slices shown (3 of 5)]
[im 1/17]
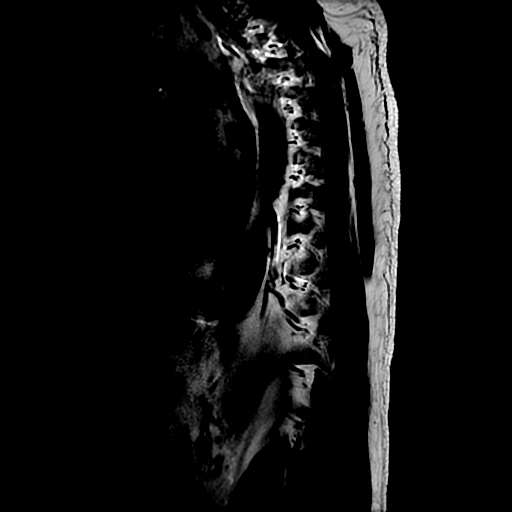
[im 17/17]
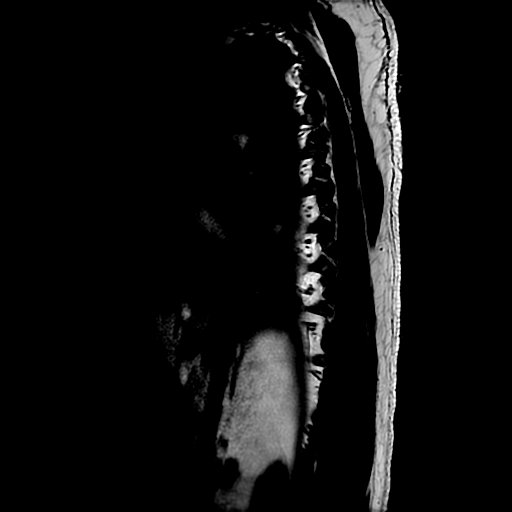

[Series 13: T2 · axial · 4.0mm · 0.39mm/px · z∈[-232,-106]mm · 3 of 24 slices shown (4 of 5)]
[im 1/24]
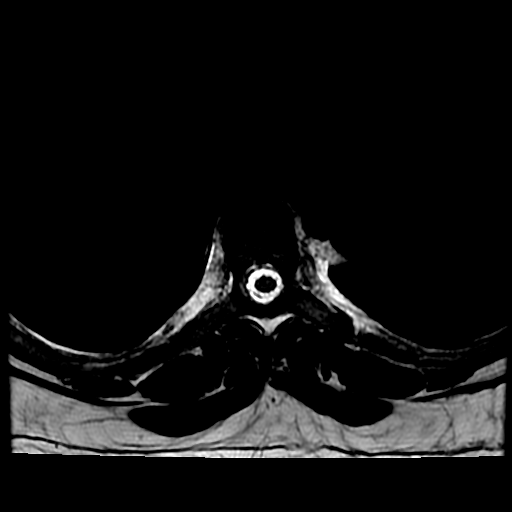
[im 12/24]
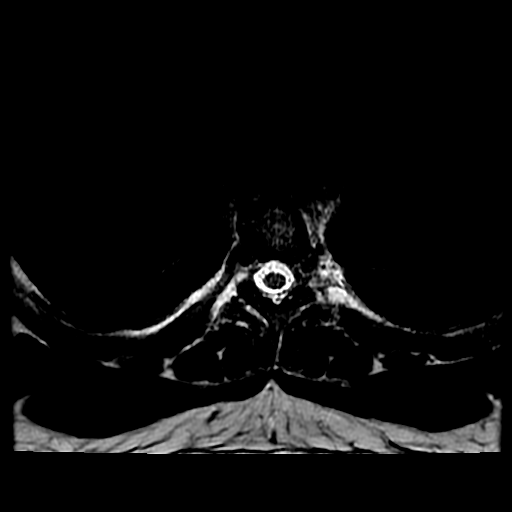
[im 24/24]
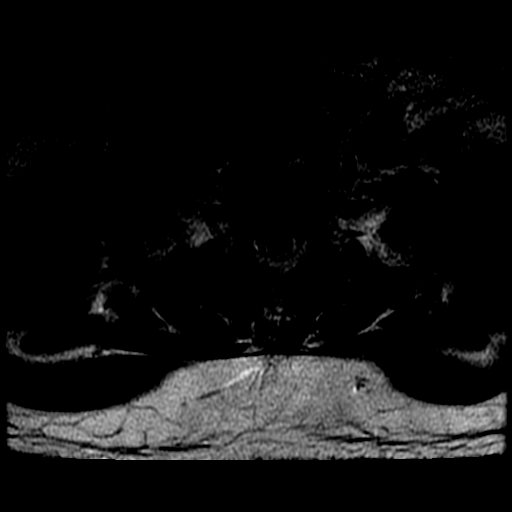

[Series 14: T2 · axial · 4.0mm · 0.39mm/px · z∈[-389,-224]mm · 4 of 31 slices shown (5 of 5)]
[im 1/31]
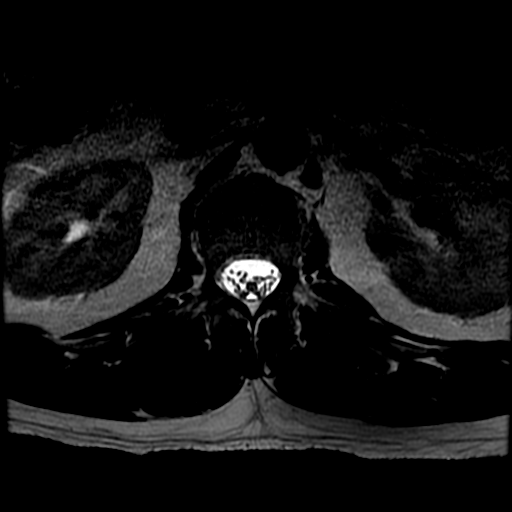
[im 11/31]
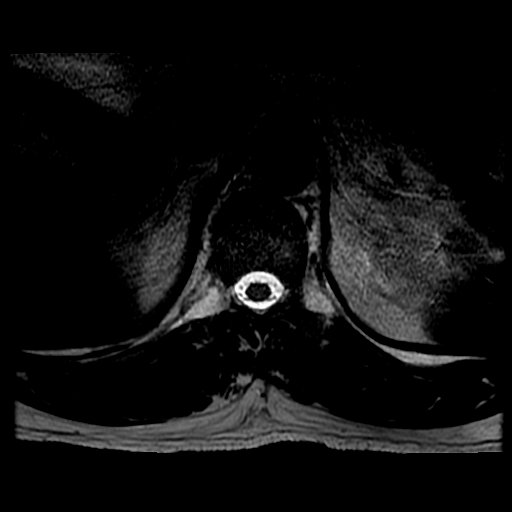
[im 21/31]
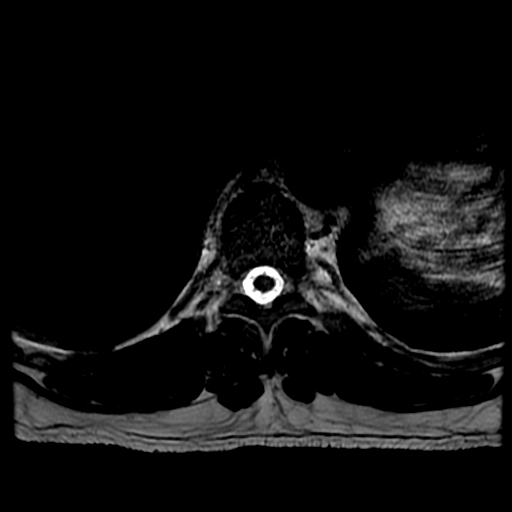
[im 31/31]
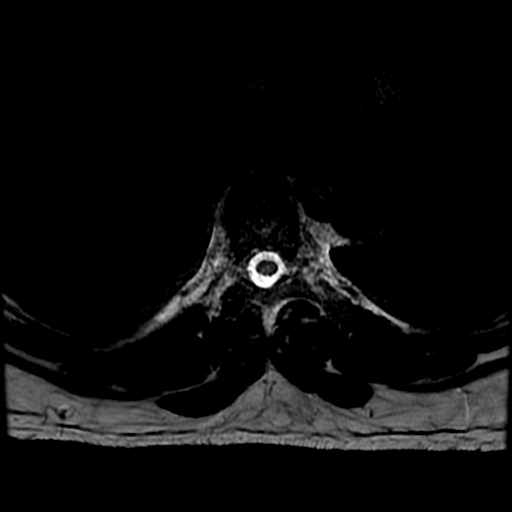

[18 of 48 positions shown; findings below may reference images not displayed]

FINDINGS: MRI CERVICAL SPINE

Motion artifact.

Alignment: Alignment is unchanged from CT.

Vertebrae: C2 vertebral body fractures are more completely evaluated
on the prior CT. There is some associated marrow edema. Linear STIR
hyperintensity underlying the C5 superior endp[REDACTED] reflect a
fracture. No evidence of anterior or posterior longitudinal ligament
disruption. Ligamentum flavum appears intact.

Cord: No abnormal cord signal.

Posterior Fossa, vertebral arteries, paraspinal tissues:
Prevertebral soft tissue edema is present at the cervicothoracic
junction. Midline and parasagittal soft tissue edema is present in
the dorsal paraspinal muscles.

Disc levels: No significant degenerative stenosis.

MRI THORACIC SPINE

Alignment:  Osseous retropulsion at T1 and T3.

Vertebrae: Fracture of the T1 vertebral body with compression
deformity as seen on prior CT with associated marrow edema. There is
also severe compression deformity of T3. Remainder of thoracic
vertebral body heights are maintained. No suspicious osseous lesion.

Cord: No abnormal cord signal. STIR hyperintensity in the dorsal
epidural space primarily from C7-T3 levels.

Paraspinal and other soft tissues: Paravertebral edema/hematoma at
the upper thoracic spine.

Disc levels: There is mild to moderate stenosis at the T1-T2 and T3
levels. No significant degenerative stenosis.
IMPRESSION: Fracture of C2 as seen on CT. Question of fracture underlying the C5
superior endplate. No evidence of significant ligamentous injury.
Likely posttraumatic cervical dorsal paraspinal soft tissue edema.

Fractures of T1 and T3 as seen on CT with endplate retropulsion and
mild to moderate canal stenosis. Abnormal dorsal epidural signal
primarily from C7-T3 levels may reflect epidural hemorrhage without
mass effect.

## 2021-07-23 IMAGING — CT CT HEAD W/O CM
3 series · 15 of 47 positions shown, 18 images · non-contrast
Comparison: None.

CLINICAL DATA: Neck pain after being hit in head by falling wall.

EXAM:
CT HEAD WITHOUT CONTRAST
CT CERVICAL SPINE WITHOUT CONTRAST
TECHNIQUE: Multidetector CT imaging of the head and cervical spine was
performed following the standard protocol without intravenous
contrast. Multiplanar CT image reconstructions of the cervical spine
were also generated.

[Series 3: head 5.0 h30s · axial · 0.42mm/px · z∈[-137,+3]mm · 9 of 34 slices shown, 12 images]
[im 3/34  brain]
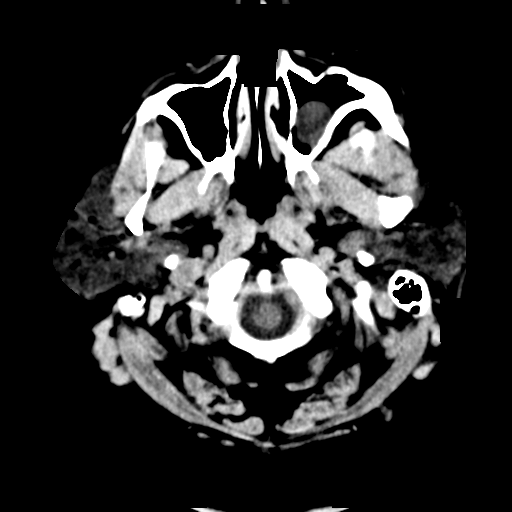
[im 3/34  bone]
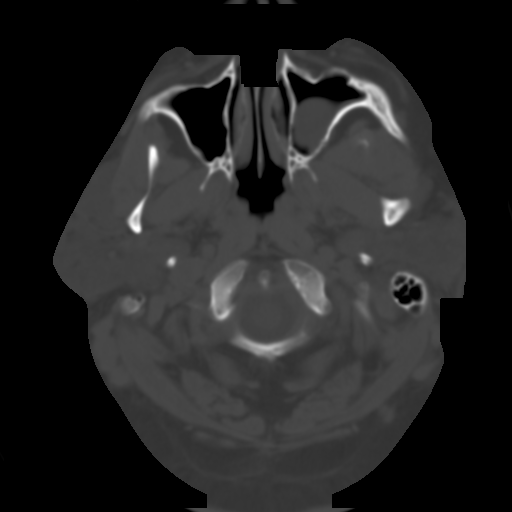
[im 6/34  brain]
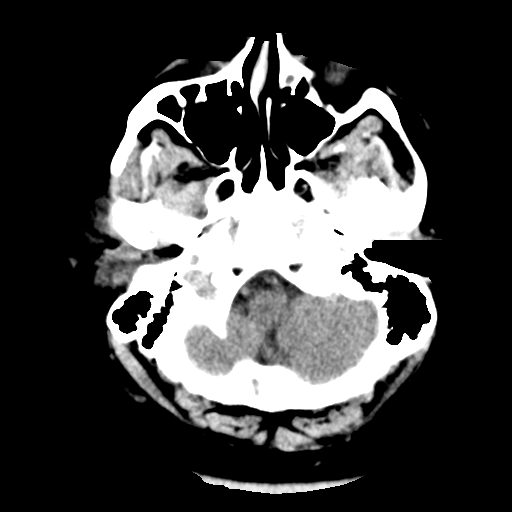
[im 10/34  brain]
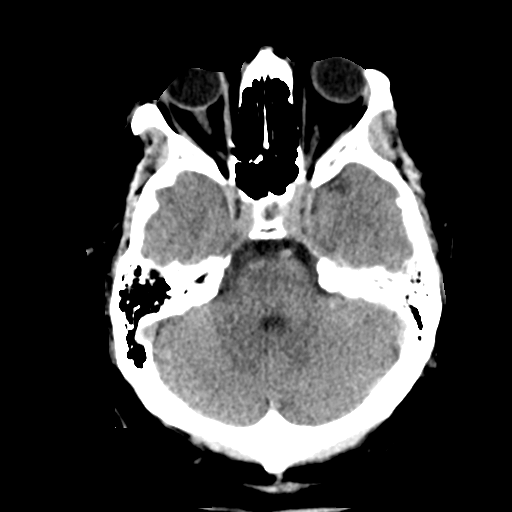
[im 13/34  brain]
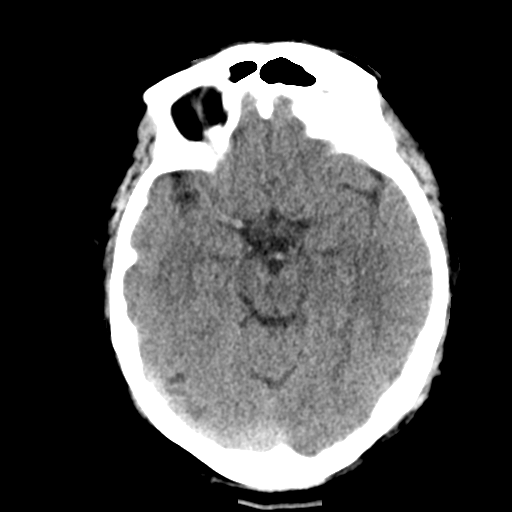
[im 18/34  brain]
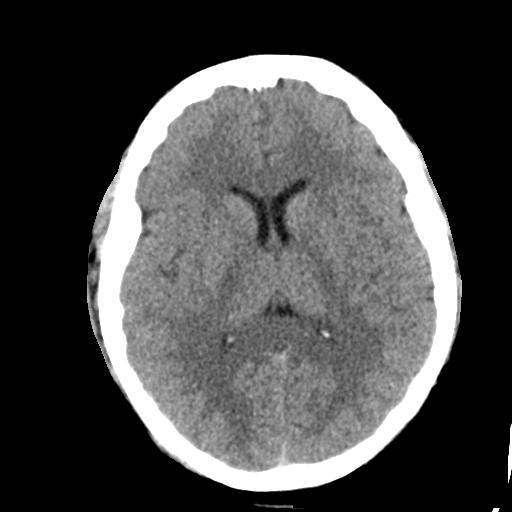
[im 18/34  bone]
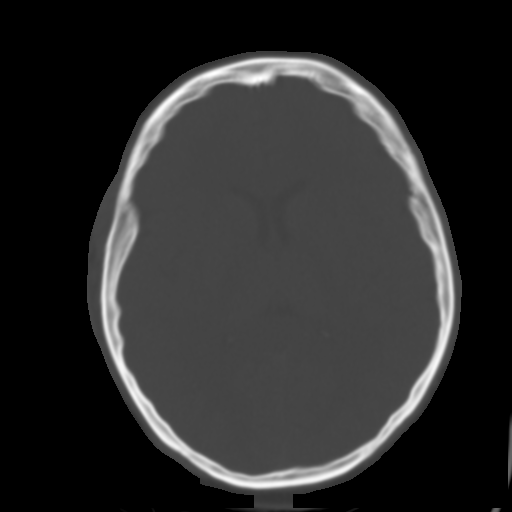
[im 21/34  brain]
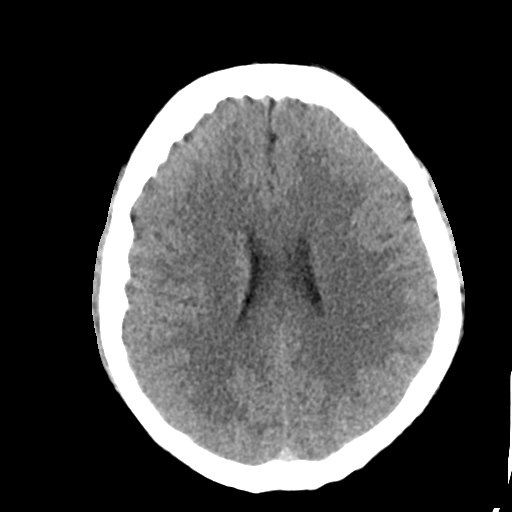
[im 24/34  brain]
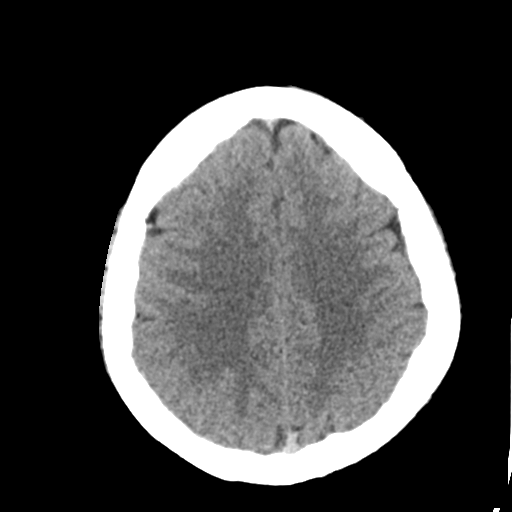
[im 28/34  brain]
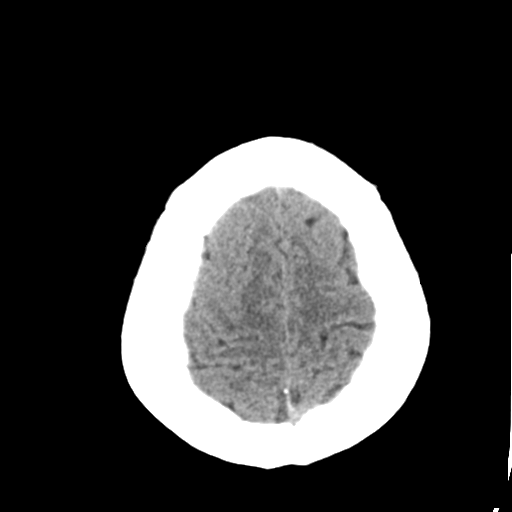
[im 31/34  brain]
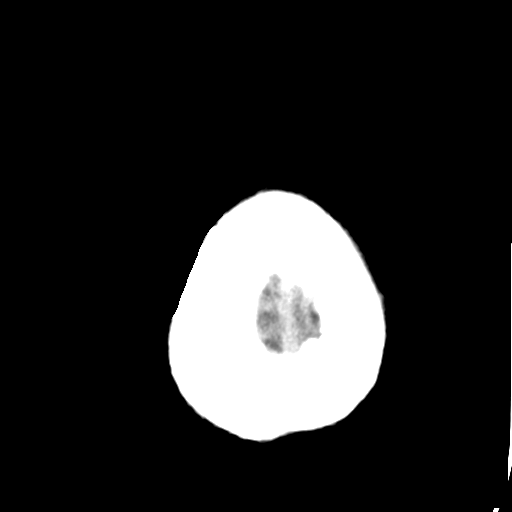
[im 31/34  bone]
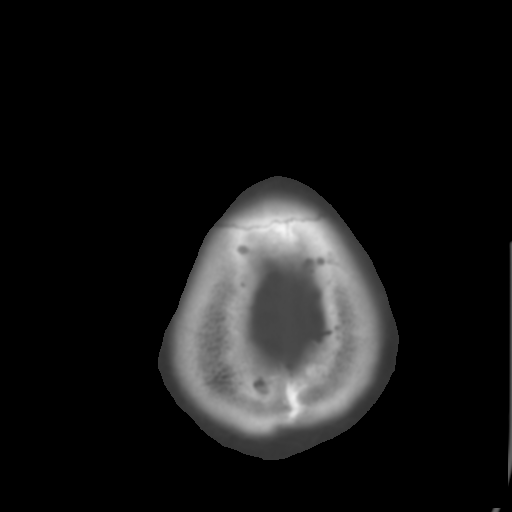

[Series 5: head 3.0 mpr cor · coronal · 0.33mm/px · 3 of 66 slices shown]
[im 22/66  brain]
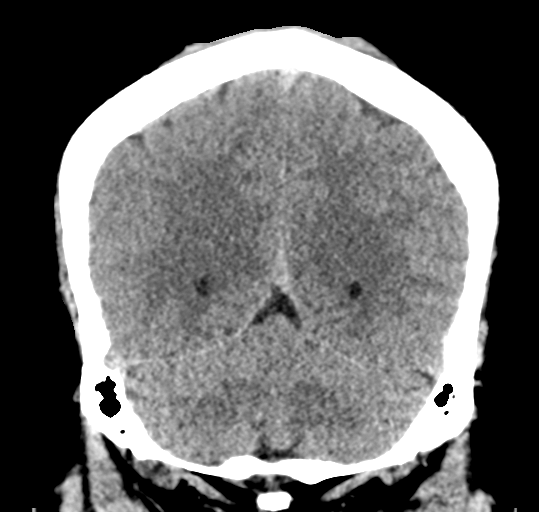
[im 29/66  brain]
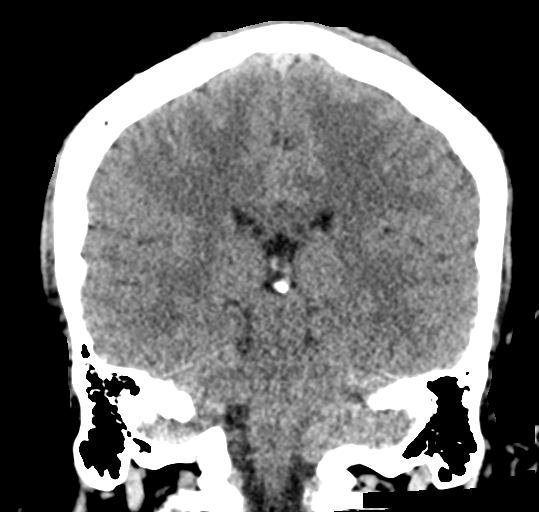
[im 37/66  brain]
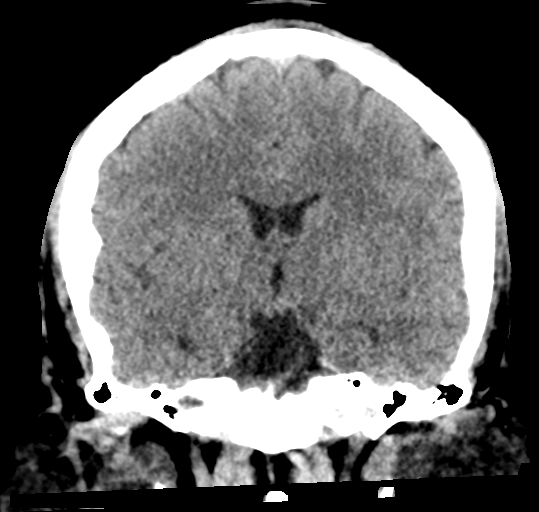

[Series 6: head 3.0 mpr sag · sagittal · 0.33mm/px · 3 of 60 slices shown]
[im 20/60  brain]
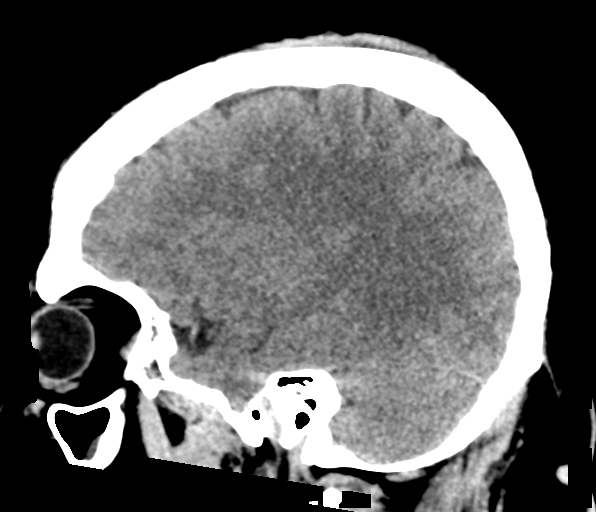
[im 30/60  brain]
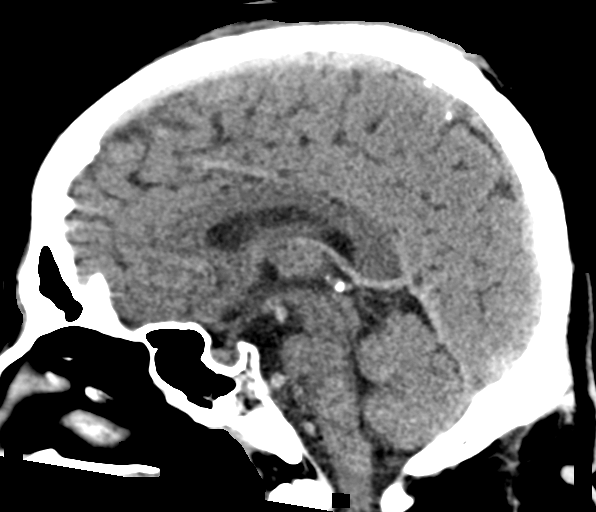
[im 40/60  brain]
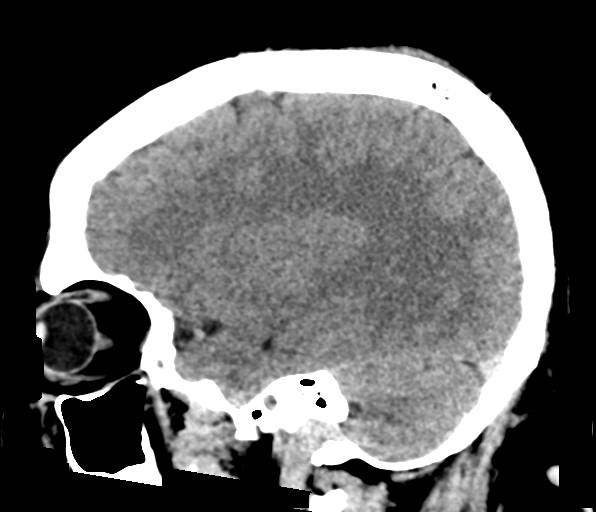

[15 of 47 positions shown; findings below may reference images not displayed]

FINDINGS: CT HEAD FINDINGS

Brain: No evidence of acute infarction, hemorrhage, hydrocephalus,
extra-axial collection or mass lesion/mass effect.

Vascular: No hyperdense vessel or unexpected calcification.

Skull: Normal. Negative for fracture or focal lesion.

Sinuses/Orbits: Left maxillary mucous retention cyst is noted.

Other: None.

CT CERVICAL SPINE FINDINGS

Alignment: Normal.

Skull base and vertebrae: There appears to be a moderately
compressed T1 fracture with possible posterior displacement of
fracture fragment into spinal canal. Moderately displaced fracture
is seen involving the posterior spinous process of C7. Mildly
displaced fractures are seen involving the bilateral pedicles of C2.
Moderately displaced fracture is also seen involving the left
lateral mass of C2, with disruption of the left transverse foramina.
Fracture is also seen extending through the right transverse
foramen.

Soft tissues and spinal canal: No prevertebral fluid or swelling. No
visible canal hematoma.

Disc levels:  Disc spaces are maintained.

Upper chest: Negative.

Other: None.
IMPRESSION: 1. Normal head CT.
2. Mildly displaced fractures are seen involving the bilateral
pedicles of C2. Moderately displaced fracture is also seen involving
the left lateral mass of C2, with disruption of the left transverse
foramina. Fracture is also seen extending through the right
transverse foramen. This is concerning for vertebral artery injury,
and CT angiography of the neck is recommended for further evaluation
3. Moderately displaced fracture is seen involving the posterior
spinous process of C7, with mildly displaced fracture involving
distal tip of spinous process of T1.
4. Moderately compressed T1 vertebral body fracture is noted with
possible posterior displacement of fracture fragment into spinal
canal.

Critical Value/emergent results were called by telephone at the time
of interpretation on 07/16/2019 at [DATE] to provider BERCHIBENS CARRELARD ,
who verbally acknowledged these results.
